# Patient Record
Sex: Male | Born: 1950 | Race: Black or African American | Hispanic: No | Marital: Single | Smoking: Never smoker
Health system: Southern US, Community
[De-identification: ages and names within clinical notes are randomized; demographics above are authoritative.]

## PROBLEM LIST (undated history)

## (undated) DIAGNOSIS — M1990 Unspecified osteoarthritis, unspecified site: Secondary | ICD-10-CM

## (undated) DIAGNOSIS — M1710 Unilateral primary osteoarthritis, unspecified knee: Secondary | ICD-10-CM

## (undated) HISTORY — PX: JOINT REPLACEMENT: SHX530

## (undated) HISTORY — PX: SHOULDER ARTHROSCOPY WITH ROTATOR CUFF REPAIR: SHX5685

## (undated) HISTORY — PX: COLONOSCOPY: SHX174

---

## 2000-01-24 ENCOUNTER — Encounter: Admission: RE | Admit: 2000-01-24 | Discharge: 2000-01-24 | Payer: Self-pay | Admitting: Family Medicine

## 2000-01-24 ENCOUNTER — Encounter: Payer: Self-pay | Admitting: Family Medicine

## 2009-08-28 ENCOUNTER — Emergency Department (HOSPITAL_COMMUNITY): Admission: EM | Admit: 2009-08-28 | Discharge: 2009-08-28 | Payer: Self-pay | Admitting: Emergency Medicine

## 2010-08-06 LAB — LIPASE, BLOOD: Lipase: 29 U/L (ref 11–59)

## 2010-08-06 LAB — COMPREHENSIVE METABOLIC PANEL
ALT: 21 U/L (ref 0–53)
AST: 29 U/L (ref 0–37)
Albumin: 4.4 g/dL (ref 3.5–5.2)
Alkaline Phosphatase: 70 U/L (ref 39–117)
BUN: 16 mg/dL (ref 6–23)
CO2: 24 mEq/L (ref 19–32)
Calcium: 9.5 mg/dL (ref 8.4–10.5)
Chloride: 101 mEq/L (ref 96–112)
Creatinine, Ser: 1.18 mg/dL (ref 0.4–1.5)
GFR calc Af Amer: >60 mL/min (ref >60)
GFR calc non Af Amer: >60 mL/min (ref >60)
Glucose, Bld: 99 mg/dL (ref 70–99)
Potassium: 3.4 mEq/L — ABNORMAL LOW (ref 3.5–5.1)
Sodium: 135 mEq/L (ref 135–145)
Total Bilirubin: 2.9 mg/dL — ABNORMAL HIGH (ref 0.3–1.2)
Total Protein: 7.9 g/dL (ref 6.0–8.3)

## 2010-08-06 LAB — DIFFERENTIAL
Basophils Absolute: 0 10*3/uL (ref 0.0–0.1)
Lymphocytes Relative: 14 % (ref 12–46)
Lymphs Abs: 1.5 10*3/uL (ref 0.7–4.0)
Monocytes Absolute: 0.8 10*3/uL (ref 0.1–1.0)
Neutro Abs: 8.9 10*3/uL — ABNORMAL HIGH (ref 1.7–7.7)

## 2010-08-06 LAB — CBC
HCT: 46.9 % (ref 39.0–52.0)
Hemoglobin: 16.4 g/dL (ref 13.0–17.0)
MCHC: 35 g/dL (ref 30.0–36.0)
MCV: 102 fL — ABNORMAL HIGH (ref 78.0–100.0)
Platelets: 246 10*3/uL (ref 150–400)
RBC: 4.6 MIL/uL (ref 4.22–5.81)
RDW: 14.7 % (ref 11.5–15.5)
WBC: 11.3 10*3/uL — ABNORMAL HIGH (ref 4.0–10.5)

## 2010-08-06 LAB — HEMOCCULT GUIAC POC 1CARD (OFFICE): Fecal Occult Bld: NEGATIVE

## 2011-07-01 ENCOUNTER — Ambulatory Visit: Payer: Non-veteran care | Admitting: Physical Therapy

## 2011-07-01 DIAGNOSIS — M25519 Pain in unspecified shoulder: Secondary | ICD-10-CM | POA: Insufficient documentation

## 2011-07-01 DIAGNOSIS — M25619 Stiffness of unspecified shoulder, not elsewhere classified: Secondary | ICD-10-CM | POA: Insufficient documentation

## 2011-07-01 DIAGNOSIS — IMO0001 Reserved for inherently not codable concepts without codable children: Secondary | ICD-10-CM | POA: Insufficient documentation

## 2011-07-01 DIAGNOSIS — M6281 Muscle weakness (generalized): Secondary | ICD-10-CM | POA: Insufficient documentation

## 2011-07-08 ENCOUNTER — Ambulatory Visit: Payer: Non-veteran care | Admitting: Physical Therapy

## 2011-07-10 ENCOUNTER — Ambulatory Visit: Payer: Non-veteran care | Admitting: Physical Therapy

## 2011-07-14 ENCOUNTER — Ambulatory Visit: Payer: Non-veteran care | Admitting: Physical Therapy

## 2011-07-16 ENCOUNTER — Ambulatory Visit: Payer: Non-veteran care | Admitting: Physical Therapy

## 2011-07-20 ENCOUNTER — Encounter: Payer: Non-veteran care | Admitting: Physical Therapy

## 2011-07-22 ENCOUNTER — Ambulatory Visit: Payer: Non-veteran care | Admitting: Physical Therapy

## 2011-07-22 DIAGNOSIS — M25619 Stiffness of unspecified shoulder, not elsewhere classified: Secondary | ICD-10-CM | POA: Insufficient documentation

## 2011-07-22 DIAGNOSIS — IMO0001 Reserved for inherently not codable concepts without codable children: Secondary | ICD-10-CM | POA: Insufficient documentation

## 2011-07-22 DIAGNOSIS — M6281 Muscle weakness (generalized): Secondary | ICD-10-CM | POA: Insufficient documentation

## 2011-07-22 DIAGNOSIS — M25519 Pain in unspecified shoulder: Secondary | ICD-10-CM | POA: Insufficient documentation

## 2011-07-28 ENCOUNTER — Ambulatory Visit: Payer: Non-veteran care | Admitting: Physical Therapy

## 2011-07-30 ENCOUNTER — Ambulatory Visit: Payer: Non-veteran care | Admitting: Physical Therapy

## 2011-08-03 ENCOUNTER — Ambulatory Visit: Payer: Non-veteran care | Admitting: Physical Therapy

## 2011-08-05 ENCOUNTER — Ambulatory Visit: Payer: Non-veteran care | Admitting: Physical Therapy

## 2011-08-10 ENCOUNTER — Ambulatory Visit: Payer: Non-veteran care | Admitting: Physical Therapy

## 2011-08-12 ENCOUNTER — Ambulatory Visit: Payer: Non-veteran care | Admitting: Physical Therapy

## 2011-08-17 ENCOUNTER — Ambulatory Visit: Payer: Non-veteran care | Admitting: Physical Therapy

## 2011-08-17 DIAGNOSIS — M6281 Muscle weakness (generalized): Secondary | ICD-10-CM | POA: Insufficient documentation

## 2011-08-17 DIAGNOSIS — M25519 Pain in unspecified shoulder: Secondary | ICD-10-CM | POA: Insufficient documentation

## 2011-08-17 DIAGNOSIS — IMO0001 Reserved for inherently not codable concepts without codable children: Secondary | ICD-10-CM | POA: Insufficient documentation

## 2011-08-17 DIAGNOSIS — M25619 Stiffness of unspecified shoulder, not elsewhere classified: Secondary | ICD-10-CM | POA: Insufficient documentation

## 2011-08-19 ENCOUNTER — Ambulatory Visit: Payer: Non-veteran care | Admitting: Physical Therapy

## 2013-04-07 ENCOUNTER — Encounter (HOSPITAL_COMMUNITY): Payer: Self-pay | Admitting: Emergency Medicine

## 2013-04-07 ENCOUNTER — Emergency Department (INDEPENDENT_AMBULATORY_CARE_PROVIDER_SITE_OTHER)
Admission: EM | Admit: 2013-04-07 | Discharge: 2013-04-07 | Disposition: A | Discharge status: Home / Self Care | Payer: Self-pay | Source: Home / Self Care | Attending: Family Medicine | Admitting: Family Medicine

## 2013-04-07 DIAGNOSIS — K0889 Other specified disorders of teeth and supporting structures: Secondary | ICD-10-CM

## 2013-04-07 DIAGNOSIS — K089 Disorder of teeth and supporting structures, unspecified: Secondary | ICD-10-CM

## 2013-04-07 MED ORDER — DICLOFENAC POTASSIUM 50 MG PO TABS: 50.0000 mg | ORAL_TABLET | Freq: Three times a day (TID) | ORAL | Status: DC

## 2013-04-07 MED ORDER — CLINDAMYCIN HCL 300 MG PO CAPS: 300.0000 mg | ORAL_CAPSULE | Freq: Three times a day (TID) | ORAL | Status: DC

## 2013-04-07 NOTE — ED Notes (Signed)
C/o waking with right sided facial swelling today.  States has a bad tooth.  Pt is scheduled on Monday for dentist appt.  Having mild pain. No otc meds tried.

## 2013-04-07 NOTE — ED Provider Notes (Signed)
CSN: [id number]     Arrival date & time 04/07/13  1028 History   First MD Initiated Contact with Patient 04/07/13 1111     Chief Complaint  Patient presents with  . Dental Pain    right sided facial swelling onset today.    (Consider location/radiation/quality/duration/timing/severity/associated sxs/prior Treatment) Patient is a 62 y.o. male presenting with tooth pain. The history is provided by the patient.  Dental Pain Location:  Upper Upper teeth location:  6/RU cuspid Quality:  Sharp and constant Severity:  Mild Duration:  1 day Progression:  Worsening Chronicity:  New Context: poor dentition     History reviewed. No pertinent past medical history. History reviewed. No pertinent past surgical history. History reviewed. No pertinent family history. History  Substance Use Topics  . Smoking status: Never Smoker   . Smokeless tobacco: Not on file  . Alcohol Use: Yes    Review of Systems  Constitutional: Negative.   HENT: Positive for dental problem.     Allergies  Review of patient's allergies indicates no known allergies.  Home Medications   Current Outpatient Rx  Name  Route  Sig  Dispense  Refill  . clindamycin (CLEOCIN) 300 MG capsule   Oral   Take 1 capsule (300 mg total) by mouth 3 (three) times daily.   28 capsule   0   . diclofenac (CATAFLAM) 50 MG tablet   Oral   Take 1 tablet (50 mg total) by mouth 3 (three) times daily.   15 tablet   0    BP 124/73  Pulse 60  Temp(Src) 97.7 F (36.5 C) (Oral)  Resp 16  SpO2 99% Physical Exam  Nursing note and vitals reviewed. Constitutional: He appears well-developed and well-nourished.  HENT:  Head: Normocephalic.  Mouth/Throat: Uvula is midline, oropharynx is clear and moist and mucous membranes are normal. Abnormal dentition.      ED Course  Procedures (including critical care time) Labs Review Labs Reviewed - No data to display Imaging Review No results found.  EKG Interpretation      Date/Time:    Ventricular Rate:    PR Interval:    QRS Duration:   QT Interval:    QTC Calculation:   R Axis:     Text Interpretation:              MDM      Linna Hoff, MD 04/07/13 1136

## 2014-05-24 ENCOUNTER — Encounter (HOSPITAL_COMMUNITY): Payer: Self-pay | Admitting: *Deleted

## 2014-05-24 ENCOUNTER — Emergency Department (HOSPITAL_COMMUNITY)
Admission: EM | Admit: 2014-05-24 | Discharge: 2014-05-24 | Disposition: A | Discharge status: Home / Self Care | Payer: No Typology Code available for payment source | Attending: Emergency Medicine | Admitting: Emergency Medicine

## 2014-05-24 DIAGNOSIS — Y998 Other external cause status: Secondary | ICD-10-CM | POA: Diagnosis not present

## 2014-05-24 DIAGNOSIS — S199XXA Unspecified injury of neck, initial encounter: Secondary | ICD-10-CM | POA: Diagnosis not present

## 2014-05-24 DIAGNOSIS — Z792 Long term (current) use of antibiotics: Secondary | ICD-10-CM | POA: Diagnosis not present

## 2014-05-24 DIAGNOSIS — S29002A Unspecified injury of muscle and tendon of back wall of thorax, initial encounter: Secondary | ICD-10-CM | POA: Insufficient documentation

## 2014-05-24 DIAGNOSIS — Y92410 Unspecified street and highway as the place of occurrence of the external cause: Secondary | ICD-10-CM | POA: Insufficient documentation

## 2014-05-24 DIAGNOSIS — Y9389 Activity, other specified: Secondary | ICD-10-CM | POA: Insufficient documentation

## 2014-05-24 DIAGNOSIS — Z791 Long term (current) use of non-steroidal anti-inflammatories (NSAID): Secondary | ICD-10-CM | POA: Diagnosis not present

## 2014-05-24 MED ORDER — TRAMADOL HCL 50 MG PO TABS: 50.0000 mg | ORAL_TABLET | Freq: Four times a day (QID) | ORAL | Status: DC | PRN

## 2014-05-24 MED ORDER — METHOCARBAMOL 500 MG PO TABS: 500.0000 mg | ORAL_TABLET | Freq: Three times a day (TID) | ORAL | Status: DC | PRN
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
## 2014-05-24 NOTE — Discharge Instructions (Signed)
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
## 2014-05-24 NOTE — ED Notes (Signed)
Pa student at the bedside.

## 2015-04-16 ENCOUNTER — Ambulatory Visit: Payer: Medicare Other | Admitting: Family

## 2015-05-02 ENCOUNTER — Ambulatory Visit: Payer: Medicare Other | Admitting: Family

## 2015-05-19 HISTORY — PX: COLONOSCOPY: SHX174

## 2015-07-17 ENCOUNTER — Ambulatory Visit: Payer: Medicare Other | Admitting: Family

## 2016-08-20 ENCOUNTER — Ambulatory Visit: Payer: Self-pay | Admitting: Podiatry

## 2017-06-15 ENCOUNTER — Telehealth: Payer: Self-pay | Admitting: General Practice

## 2017-06-15 NOTE — Telephone Encounter (Signed)
Copied from [place] [id number]. Topic: Appointment Scheduling - Prior Auth Required for Appointment >> Jun 15, 2017 10:18 AM Antonieta Iba C wrote: Pt called in to request an apt with Dr. Jenny Reichmann, pt says that his wife Mark Cordova is a current pt. He would like to know if provider would take him on?   CB: [phone]     Dr.John I thought I would ask, because he had a Cancel appointment and 2 No shows trying to est care with [facility] in the past. Please advise.

## 2017-06-15 NOTE — Telephone Encounter (Signed)
Ok with me 

## 2017-06-15 NOTE — Telephone Encounter (Signed)
Called patient and NO VM set up.   If patient calls back please set them up a New patient appointment with Dr.John.

## 2017-07-02 ENCOUNTER — Ambulatory Visit (INDEPENDENT_AMBULATORY_CARE_PROVIDER_SITE_OTHER): Payer: Non-veteran care | Admitting: Orthopaedic Surgery

## 2017-07-02 ENCOUNTER — Ambulatory Visit (INDEPENDENT_AMBULATORY_CARE_PROVIDER_SITE_OTHER): Payer: Non-veteran care

## 2017-07-02 DIAGNOSIS — M1711 Unilateral primary osteoarthritis, right knee: Secondary | ICD-10-CM

## 2017-07-02 NOTE — Progress Notes (Signed)
   Office Visit Note   Patient: Mark Cordova           Date of Birth: 09-Jul-1950           MRN: [id number] Visit Date: 07/02/2017              Requested by: Tilda Franco, PA-C [id number], [place] [address] PCP: Patient, No Pcp Per   Assessment & Plan: Visit Diagnoses:  1. Primary osteoarthritis of right knee     Plan: Impression is right knee degenerative joint disease.  We reviewed the x-rays with the patient today.  He is ready for a total knee replacement.  We discussed the risk benefits alternatives of surgery he understands and wished to proceed.  Patient is quite healthy and will without any history of DVT, tobacco use, diabetes.  Questions encouraged and answered.  Follow-Up Instructions: Return if symptoms worsen or fail to improve.   Orders:  Orders Placed This Encounter  Procedures  . XR Knee 1-2 Views Right   No orders of the defined types were placed in this encounter.     Procedures: No procedures performed   Clinical Data: No additional findings.   Subjective: Chief Complaint  Patient presents with  . Right Knee - Pain    Patient is a very pleasant 67 year old gentleman chronic right knee pain for years.  He denies any previous surgeries or injuries.  He served in the [employer].  He has significant difficulty with ADLs and chronic night pain.  He has tried cortisone injections as well as other conservative treatment without any significant relief.    Review of Systems  Constitutional: Negative.   All other systems reviewed and are negative.    Objective: Vital Signs: There were no vitals taken for this visit.  Physical Exam  Constitutional: He is oriented to person, place, and time. He appears well-developed and well-nourished.  HENT:  Head: Normocephalic and atraumatic.  Eyes: Pupils are equal, round, and reactive to light.  Neck: Neck supple.  Pulmonary/Chest: Effort normal.  Abdominal: Soft.    Musculoskeletal: Normal range of motion.  Neurological: He is alert and oriented to person, place, and time.  Skin: Skin is warm.  Psychiatric: He has a normal mood and affect. His behavior is normal. Judgment and thought content normal.  Nursing note and vitals reviewed.   Ortho Exam Right knee exam shows a small effusion.  Mild valgus alignment.  Range of motion is well-preserved.  Collaterals and cruciates are stable. Specialty Comments:  No specialty comments available.  Imaging: Xr Knee 1-2 Views Right  Result Date: 07/02/2017 Advanced degenerative joint disease with valgus alignment    PMFS History: There are no active problems to display for this patient.  No past medical history on file.  No family history on file.  No past surgical history on file. Social History   Occupational History  . Not on file  Tobacco Use  . Smoking status: Never Smoker  Substance and Sexual Activity  . Alcohol use: Yes  . Drug use: No  . Sexual activity: Yes    Birth control/protection: None

## 2017-07-05 ENCOUNTER — Other Ambulatory Visit (INDEPENDENT_AMBULATORY_CARE_PROVIDER_SITE_OTHER): Payer: Self-pay

## 2017-07-06 ENCOUNTER — Inpatient Hospital Stay (HOSPITAL_COMMUNITY)
Admission: RE | Admit: 2017-07-06 | Discharge: 2017-07-06 | Disposition: A | Discharge status: Home / Self Care | Payer: Medicare Other | Source: Ambulatory Visit

## 2017-07-06 NOTE — Pre-Procedure Instructions (Signed)
[facility]  07/06/2017      [facility] [address] [place], [place] [address] Phone: [phone] Fax: [phone]    Your procedure is scheduled on 07/14/2017.  Report to [facility] Admitting at 1030 A.M.  Call this number if you have problems the morning of surgery:  [phone]   Remember:  Do not eat food or drink liquids after midnight.   Continue all medications as directed by your physician except follow these medication instructions before surgery below   Take these medicines the morning of surgery with A SIP OF WATER: NONE  7 days prior to surgery STOP taking any Aspirin (unless otherwise instructed by your surgeon), Aleve, Naproxen, Ibuprofen, Motrin, Advil, Goody's, BC's, all herbal medications, fish oil, and all vitamins  Follow your doctors instructions regarding your Aspirin.  If no instructions were given by your doctor, then you will need to call the prescribing office office to get instructions.      Do not wear jewelry.  Do not wear lotions, powders, or colognes, or deodorant.  Men may shave face and neck.  Do not bring valuables to the hospital.  [facility] is not responsible for any belongings or valuables.  Hearing aids, eyeglasses, contacts, dentures, partials or bridgework may not be worn into surgery.  Leave your suitcase in the car.  After surgery it may be brought to your room.  For patients admitted to the hospital, discharge time will be determined by your treatment team.  Patients discharged the day of surgery will not be allowed to drive home.   Name and phone number of your driver:    Special instructions:   [facility]- Preparing For Surgery  Before surgery, you can play an important role. Because skin is not sterile, your skin needs to be as free of germs as possible. You can reduce the number of germs on your skin by washing with CHG (chlorahexidine  gluconate) Soap before surgery.  CHG is an antiseptic cleaner which kills germs and bonds with the skin to continue killing germs even after washing.  Please do not use if you have an allergy to CHG or antibacterial soaps. If your skin becomes reddened/irritated stop using the CHG.  Do not shave (including legs and underarms) for at least 48 hours prior to first CHG shower. It is OK to shave your face.  Please follow these instructions carefully.   1. Shower the NIGHT BEFORE SURGERY and the MORNING OF SURGERY with CHG.   2. If you chose to wash your hair, wash your hair first as usual with your normal shampoo.  3. After you shampoo, rinse your hair and body thoroughly to remove the shampoo.  4. Use CHG as you would any other liquid soap. You can apply CHG directly to the skin and wash gently with a scrungie or a clean washcloth.   5. Apply the CHG Soap to your body ONLY FROM THE NECK DOWN.  Do not use on open wounds or open sores. Avoid contact with your eyes, ears, mouth and genitals (private parts). Wash Face and genitals (private parts)  with your normal soap.  6. Wash thoroughly, paying special attention to the area where your surgery will be performed.  7. Thoroughly rinse your body with warm water from the neck down.  8. DO NOT shower/wash with your normal soap after using and rinsing off the CHG Soap.  9. Pat yourself  dry with a CLEAN TOWEL.  10. Wear CLEAN PAJAMAS to bed the night before surgery, wear comfortable clothes the morning of surgery  11. Place CLEAN SHEETS on your bed the night of your first shower and DO NOT SLEEP WITH PETS.    Day of Surgery: Shower as stated above. Do not apply any deodorants/lotions.  Please wear clean clothes to the hospital/surgery center.      Please read over the following fact sheets that you were given.

## 2017-07-07 ENCOUNTER — Encounter (HOSPITAL_COMMUNITY): Payer: Self-pay

## 2017-07-07 ENCOUNTER — Other Ambulatory Visit: Payer: Self-pay

## 2017-07-07 ENCOUNTER — Ambulatory Visit (HOSPITAL_COMMUNITY)
Admission: RE | Admit: 2017-07-07 | Discharge: 2017-07-07 | Disposition: A | Discharge status: Home / Self Care | Payer: Medicare HMO | Source: Ambulatory Visit | Attending: Physician Assistant | Admitting: Physician Assistant

## 2017-07-07 ENCOUNTER — Encounter (HOSPITAL_COMMUNITY)
Admission: RE | Admit: 2017-07-07 | Discharge: 2017-07-07 | Disposition: A | Discharge status: Home / Self Care | Payer: Medicare HMO | Source: Ambulatory Visit | Attending: Orthopaedic Surgery | Admitting: Orthopaedic Surgery

## 2017-07-07 DIAGNOSIS — J449 Chronic obstructive pulmonary disease, unspecified: Secondary | ICD-10-CM | POA: Diagnosis not present

## 2017-07-07 DIAGNOSIS — Z01818 Encounter for other preprocedural examination: Secondary | ICD-10-CM | POA: Diagnosis not present

## 2017-07-07 DIAGNOSIS — M1711 Unilateral primary osteoarthritis, right knee: Secondary | ICD-10-CM | POA: Diagnosis not present

## 2017-07-07 DIAGNOSIS — R001 Bradycardia, unspecified: Secondary | ICD-10-CM | POA: Insufficient documentation

## 2017-07-07 HISTORY — DX: Unspecified osteoarthritis, unspecified site: M19.90

## 2017-07-07 LAB — CBC WITH DIFFERENTIAL/PLATELET
BASOS ABS: 0 10*3/uL (ref 0.0–0.1)
Basophils Relative: 0 %
EOS PCT: 2 %
Eosinophils Absolute: 0.1 10*3/uL (ref 0.0–0.7)
HCT: 40.3 % (ref 39.0–52.0)
Hemoglobin: 14.4 g/dL (ref 13.0–17.0)
Lymphocytes Relative: 41 %
Lymphs Abs: 1.9 10*3/uL (ref 0.7–4.0)
MCH: 35.4 pg — ABNORMAL HIGH (ref 26.0–34.0)
MCHC: 35.7 g/dL (ref 30.0–36.0)
MCV: 99 fL (ref 78.0–100.0)
MONO ABS: 0.3 10*3/uL (ref 0.1–1.0)
Monocytes Relative: 7 %
Neutro Abs: 2.3 10*3/uL (ref 1.7–7.7)
Neutrophils Relative %: 50 %
Platelets: 244 10*3/uL (ref 150–400)
RBC: 4.07 MIL/uL — ABNORMAL LOW (ref 4.22–5.81)
RDW: 14.6 % (ref 11.5–15.5)
WBC: 4.6 10*3/uL (ref 4.0–10.5)

## 2017-07-07 LAB — COMPREHENSIVE METABOLIC PANEL
ALT: 16 U/L — ABNORMAL LOW (ref 17–63)
AST: 22 U/L (ref 15–41)
Albumin: 3.6 g/dL (ref 3.5–5.0)
Alkaline Phosphatase: 69 U/L (ref 38–126)
Anion gap: 8 (ref 5–15)
BUN: 14 mg/dL (ref 6–20)
CHLORIDE: 104 mmol/L (ref 101–111)
CO2: 26 mmol/L (ref 22–32)
Calcium: 9.1 mg/dL (ref 8.9–10.3)
Creatinine, Ser: 1.01 mg/dL (ref 0.61–1.24)
GFR calc Af Amer: >60 mL/min (ref >60)
GFR calc non Af Amer: >60 mL/min (ref >60)
GLUCOSE: 88 mg/dL (ref 65–99)
POTASSIUM: 4.2 mmol/L (ref 3.5–5.1)
Sodium: 138 mmol/L (ref 135–145)
Total Bilirubin: 1.1 mg/dL (ref 0.3–1.2)
Total Protein: 6.3 g/dL — ABNORMAL LOW (ref 6.5–8.1)

## 2017-07-07 LAB — TYPE AND SCREEN
ABO/RH(D): A POS
Antibody Screen: NEGATIVE

## 2017-07-07 LAB — ABO/RH: ABO/RH(D): A POS

## 2017-07-07 LAB — PROTIME-INR
INR: 1.03
PROTHROMBIN TIME: 13.4 s (ref 11.4–15.2)

## 2017-07-07 LAB — SURGICAL PCR SCREEN
MRSA, PCR: NEGATIVE
STAPHYLOCOCCUS AUREUS: NEGATIVE

## 2017-07-07 LAB — APTT: APTT: 38 s — AB (ref 24–36)

## 2017-07-07 NOTE — Progress Notes (Signed)
PCP - [facility] Cardiologist - denies  Chest x-ray - 07/07/17 EKG - 07/07/17 Stress Test - debnies ECHO - denies Cardiac Cath - denies      Aspirin Instructions: last dose 07/07/17  Anesthesia review: NO  Patient denies shortness of breath, fever, cough and chest pain at PAT appointment   Patient verbalized understanding of instructions that were given to them at the PAT appointment. Patient was also instructed that they will need to review over the PAT instructions again at home before surgery.

## 2017-07-13 MED ORDER — TRANEXAMIC ACID 1000 MG/10ML IV SOLN
1000.0000 mg | INTRAVENOUS | Status: AC
  Administered 2017-07-14: 1000 mg via INTRAVENOUS
  Filled 2017-07-13: qty 1100

## 2017-07-13 MED ORDER — TRANEXAMIC ACID 1000 MG/10ML IV SOLN
2000.0000 mg | INTRAVENOUS | Status: DC
  Filled 2017-07-13: qty 20

## 2017-07-14 ENCOUNTER — Encounter (HOSPITAL_COMMUNITY): Payer: Self-pay | Admitting: Anesthesiology

## 2017-07-14 ENCOUNTER — Inpatient Hospital Stay (HOSPITAL_COMMUNITY): Payer: No Typology Code available for payment source | Admitting: Vascular Surgery

## 2017-07-14 ENCOUNTER — Inpatient Hospital Stay (HOSPITAL_COMMUNITY): Payer: No Typology Code available for payment source

## 2017-07-14 ENCOUNTER — Inpatient Hospital Stay (HOSPITAL_COMMUNITY): Payer: No Typology Code available for payment source | Admitting: Anesthesiology

## 2017-07-14 ENCOUNTER — Encounter (HOSPITAL_COMMUNITY)
Admission: RE | Disposition: A | Discharge status: Home Health | Payer: Self-pay | Source: Ambulatory Visit | Attending: Orthopaedic Surgery

## 2017-07-14 ENCOUNTER — Inpatient Hospital Stay (HOSPITAL_COMMUNITY)
Admission: RE | Admit: 2017-07-14 | Discharge: 2017-07-16 | DRG: 470 | Disposition: A | Discharge status: Home Health | Payer: No Typology Code available for payment source | Source: Ambulatory Visit | Attending: Orthopaedic Surgery | Admitting: Orthopaedic Surgery

## 2017-07-14 ENCOUNTER — Other Ambulatory Visit: Payer: Self-pay

## 2017-07-14 DIAGNOSIS — M25561 Pain in right knee: Secondary | ICD-10-CM | POA: Diagnosis present

## 2017-07-14 DIAGNOSIS — M1711 Unilateral primary osteoarthritis, right knee: Secondary | ICD-10-CM | POA: Diagnosis present

## 2017-07-14 DIAGNOSIS — Z96659 Presence of unspecified artificial knee joint: Secondary | ICD-10-CM

## 2017-07-14 DIAGNOSIS — D62 Acute posthemorrhagic anemia: Secondary | ICD-10-CM | POA: Diagnosis not present

## 2017-07-14 DIAGNOSIS — Z7982 Long term (current) use of aspirin: Secondary | ICD-10-CM

## 2017-07-14 HISTORY — PX: TOTAL KNEE ARTHROPLASTY: SHX125

## 2017-07-14 HISTORY — DX: Unilateral primary osteoarthritis, unspecified knee: M17.10

## 2017-07-14 SURGERY — ARTHROPLASTY, KNEE, TOTAL
Anesthesia: Spinal | Site: Knee | Laterality: Right

## 2017-07-14 MED ORDER — ASPIRIN EC 325 MG PO TBEC: 325.0000 mg | DELAYED_RELEASE_TABLET | Freq: Two times a day (BID) | ORAL | 0 refills | Status: DC

## 2017-07-14 MED ORDER — VANCOMYCIN HCL POWD
Status: DC | PRN
  Administered 2017-07-14: 1000 mg

## 2017-07-14 MED ORDER — CHLORHEXIDINE GLUCONATE 4 % EX LIQD: 60.0000 mL | Freq: Once | CUTANEOUS | Status: DC

## 2017-07-14 MED ORDER — MIDAZOLAM HCL 2 MG/2ML IJ SOLN
INTRAMUSCULAR | Status: AC
  Administered 2017-07-14: 2 mg via INTRAVENOUS
  Filled 2017-07-14: qty 2

## 2017-07-14 MED ORDER — PROPOFOL 10 MG/ML IV BOLUS
INTRAVENOUS | Status: DC | PRN
  Administered 2017-07-14 (×2): 20 mg via INTRAVENOUS
  Administered 2017-07-14: 10 mg via INTRAVENOUS
  Administered 2017-07-14: 20 mg via INTRAVENOUS

## 2017-07-14 MED ORDER — ONDANSETRON HCL 4 MG PO TABS: 4.0000 mg | ORAL_TABLET | Freq: Four times a day (QID) | ORAL | Status: DC | PRN

## 2017-07-14 MED ORDER — ROPIVACAINE HCL 7.5 MG/ML IJ SOLN
INTRAMUSCULAR | Status: DC | PRN
  Administered 2017-07-14: 20 mL via PERINEURAL

## 2017-07-14 MED ORDER — SENNOSIDES-DOCUSATE SODIUM 8.6-50 MG PO TABS: 1.0000 | ORAL_TABLET | Freq: Every evening | ORAL | 1 refills | Status: DC | PRN

## 2017-07-14 MED ORDER — ALUM & MAG HYDROXIDE-SIMETH 200-200-20 MG/5ML PO SUSP: 30.0000 mL | ORAL | Status: DC | PRN

## 2017-07-14 MED ORDER — TIZANIDINE HCL 4 MG PO TABS: 4.0000 mg | ORAL_TABLET | Freq: Four times a day (QID) | ORAL | 2 refills | Status: DC | PRN

## 2017-07-14 MED ORDER — BUPIVACAINE LIPOSOME 1.3 % IJ SUSP
INTRAMUSCULAR | Status: DC | PRN
  Administered 2017-07-14: 20 mL

## 2017-07-14 MED ORDER — OXYCODONE HCL ER 10 MG PO T12A: 10.0000 mg | EXTENDED_RELEASE_TABLET | Freq: Two times a day (BID) | ORAL | 0 refills | Status: DC

## 2017-07-14 MED ORDER — DOCUSATE SODIUM 100 MG PO CAPS
100.0000 mg | ORAL_CAPSULE | Freq: Two times a day (BID) | ORAL | Status: DC
  Administered 2017-07-14 – 2017-07-15 (×2): 100 mg via ORAL
  Filled 2017-07-14 (×2): qty 1

## 2017-07-14 MED ORDER — CEFAZOLIN SODIUM-DEXTROSE 2-4 GM/100ML-% IV SOLN
INTRAVENOUS | Status: AC
  Filled 2017-07-14: qty 100

## 2017-07-14 MED ORDER — METHOCARBAMOL 500 MG PO TABS
500.0000 mg | ORAL_TABLET | Freq: Four times a day (QID) | ORAL | Status: DC | PRN
  Administered 2017-07-14 – 2017-07-15 (×4): 500 mg via ORAL
  Filled 2017-07-14 (×4): qty 1

## 2017-07-14 MED ORDER — DEXTROSE 5 % IV SOLN: 500.0000 mg | Freq: Four times a day (QID) | INTRAVENOUS | Status: DC | PRN

## 2017-07-14 MED ORDER — HYDROCODONE-ACETAMINOPHEN 7.5-325 MG PO TABS: 1.0000 | ORAL_TABLET | Freq: Once | ORAL | Status: DC | PRN

## 2017-07-14 MED ORDER — METOCLOPRAMIDE HCL 5 MG/ML IJ SOLN: 5.0000 mg | Freq: Three times a day (TID) | INTRAMUSCULAR | Status: DC | PRN

## 2017-07-14 MED ORDER — PHENYLEPHRINE HCL 10 MG/ML IJ SOLN
INTRAMUSCULAR | Status: DC | PRN
  Administered 2017-07-14: 80 ug via INTRAVENOUS
  Administered 2017-07-14: 120 ug via INTRAVENOUS

## 2017-07-14 MED ORDER — MIDAZOLAM HCL 2 MG/2ML IJ SOLN
2.0000 mg | Freq: Once | INTRAMUSCULAR | Status: AC
  Administered 2017-07-14: 2 mg via INTRAVENOUS

## 2017-07-14 MED ORDER — SODIUM CHLORIDE 0.9% FLUSH
INTRAVENOUS | Status: DC | PRN
  Administered 2017-07-14: 40 mL

## 2017-07-14 MED ORDER — ONDANSETRON HCL 4 MG/2ML IJ SOLN
4.0000 mg | Freq: Four times a day (QID) | INTRAMUSCULAR | Status: DC | PRN
  Administered 2017-07-14 – 2017-07-15 (×2): 4 mg via INTRAVENOUS
  Filled 2017-07-14 (×2): qty 2

## 2017-07-14 MED ORDER — ACYCLOVIR 400 MG PO TABS: 400.0000 mg | ORAL_TABLET | Freq: Three times a day (TID) | ORAL | Status: DC | PRN

## 2017-07-14 MED ORDER — 0.9 % SODIUM CHLORIDE (POUR BTL) OPTIME
TOPICAL | Status: DC | PRN
  Administered 2017-07-14: 1000 mL

## 2017-07-14 MED ORDER — CEFAZOLIN SODIUM-DEXTROSE 2-4 GM/100ML-% IV SOLN
2.0000 g | INTRAVENOUS | Status: AC
  Administered 2017-07-14: 2 g via INTRAVENOUS

## 2017-07-14 MED ORDER — DIPHENHYDRAMINE HCL 12.5 MG/5ML PO ELIX: 25.0000 mg | ORAL_SOLUTION | ORAL | Status: DC | PRN

## 2017-07-14 MED ORDER — DEXAMETHASONE SODIUM PHOSPHATE 10 MG/ML IJ SOLN
INTRAMUSCULAR | Status: DC | PRN
  Administered 2017-07-14: 10 mg via INTRAVENOUS

## 2017-07-14 MED ORDER — FENTANYL CITRATE (PF) 100 MCG/2ML IJ SOLN
100.0000 ug | Freq: Once | INTRAMUSCULAR | Status: AC
  Administered 2017-07-14: 100 ug via INTRAVENOUS

## 2017-07-14 MED ORDER — VANCOMYCIN HCL 1000 MG IV SOLR
INTRAVENOUS | Status: AC
  Filled 2017-07-14: qty 1000

## 2017-07-14 MED ORDER — FENTANYL CITRATE (PF) 100 MCG/2ML IJ SOLN
INTRAMUSCULAR | Status: AC
  Administered 2017-07-14: 100 ug via INTRAVENOUS
  Filled 2017-07-14: qty 2

## 2017-07-14 MED ORDER — LACTATED RINGERS IV SOLN
INTRAVENOUS | Status: DC
  Administered 2017-07-14: 11:00:00 via INTRAVENOUS

## 2017-07-14 MED ORDER — HYDROMORPHONE HCL 1 MG/ML IJ SOLN: 0.2500 mg | INTRAMUSCULAR | Status: DC | PRN

## 2017-07-14 MED ORDER — MAGNESIUM CITRATE PO SOLN: 1.0000 | Freq: Once | ORAL | Status: DC | PRN

## 2017-07-14 MED ORDER — ASPIRIN EC 325 MG PO TBEC
325.0000 mg | DELAYED_RELEASE_TABLET | Freq: Two times a day (BID) | ORAL | Status: DC
  Administered 2017-07-14 – 2017-07-15 (×3): 325 mg via ORAL
  Filled 2017-07-14 (×4): qty 1

## 2017-07-14 MED ORDER — MEPERIDINE HCL 50 MG/ML IJ SOLN: 6.2500 mg | INTRAMUSCULAR | Status: DC | PRN

## 2017-07-14 MED ORDER — PHENOL 1.4 % MT LIQD: 1.0000 | OROMUCOSAL | Status: DC | PRN

## 2017-07-14 MED ORDER — TRANEXAMIC ACID 1000 MG/10ML IV SOLN
1000.0000 mg | Freq: Once | INTRAVENOUS | Status: AC
  Administered 2017-07-14: 1000 mg via INTRAVENOUS
  Filled 2017-07-14: qty 10

## 2017-07-14 MED ORDER — PROPOFOL 500 MG/50ML IV EMUL
INTRAVENOUS | Status: DC | PRN
  Administered 2017-07-14: 75 ug/kg/min via INTRAVENOUS

## 2017-07-14 MED ORDER — LACTATED RINGERS IV SOLN
INTRAVENOUS | Status: DC | PRN
  Administered 2017-07-14 (×2): via INTRAVENOUS

## 2017-07-14 MED ORDER — DEXAMETHASONE SODIUM PHOSPHATE 10 MG/ML IJ SOLN
10.0000 mg | Freq: Once | INTRAMUSCULAR | Status: AC
  Administered 2017-07-15: 10 mg via INTRAVENOUS
  Filled 2017-07-14: qty 1

## 2017-07-14 MED ORDER — PROMETHAZINE HCL 25 MG PO TABS: 25.0000 mg | ORAL_TABLET | Freq: Four times a day (QID) | ORAL | 1 refills | Status: DC | PRN

## 2017-07-14 MED ORDER — SODIUM CHLORIDE 0.9 % IV SOLN
INTRAVENOUS | Status: DC
  Administered 2017-07-14 – 2017-07-15 (×2): via INTRAVENOUS

## 2017-07-14 MED ORDER — ONDANSETRON HCL 4 MG PO TABS
4.0000 mg | ORAL_TABLET | Freq: Three times a day (TID) | ORAL | 0 refills | Status: DC | PRN
Start: 2017-07-14 — End: 2019-09-19

## 2017-07-14 MED ORDER — ONDANSETRON HCL 4 MG/2ML IJ SOLN: 4.0000 mg | Freq: Once | INTRAMUSCULAR | Status: DC | PRN

## 2017-07-14 MED ORDER — BUPIVACAINE IN DEXTROSE 0.75-8.25 % IT SOLN
INTRATHECAL | Status: DC | PRN
  Administered 2017-07-14: 2 mL via INTRATHECAL

## 2017-07-14 MED ORDER — CEFAZOLIN SODIUM-DEXTROSE 2-4 GM/100ML-% IV SOLN
2.0000 g | Freq: Four times a day (QID) | INTRAVENOUS | Status: AC
  Administered 2017-07-14 – 2017-07-15 (×3): 2 g via INTRAVENOUS
  Filled 2017-07-14 (×3): qty 100

## 2017-07-14 MED ORDER — VITAMIN D 1000 UNITS PO TABS
1000.0000 [IU] | ORAL_TABLET | Freq: Every day | ORAL | Status: DC
  Administered 2017-07-15: 1000 [IU] via ORAL

## 2017-07-14 MED ORDER — OXYCODONE HCL 5 MG PO TABS: 5.0000 mg | ORAL_TABLET | ORAL | 0 refills | Status: DC | PRN

## 2017-07-14 MED ORDER — PHENYLEPHRINE HCL 10 MG/ML IJ SOLN
INTRAVENOUS | Status: DC | PRN
  Administered 2017-07-14: 25 ug/min via INTRAVENOUS

## 2017-07-14 MED ORDER — OXYCODONE HCL 5 MG PO TABS
10.0000 mg | ORAL_TABLET | ORAL | Status: DC | PRN
  Administered 2017-07-14 – 2017-07-15 (×4): 10 mg via ORAL
  Filled 2017-07-14 (×4): qty 2

## 2017-07-14 MED ORDER — SODIUM CHLORIDE 0.9 % IR SOLN
Status: DC | PRN
  Administered 2017-07-14: 1

## 2017-07-14 MED ORDER — METOCLOPRAMIDE HCL 5 MG PO TABS: 5.0000 mg | ORAL_TABLET | Freq: Three times a day (TID) | ORAL | Status: DC | PRN

## 2017-07-14 MED ORDER — POLYETHYLENE GLYCOL 3350 17 G PO PACK: 17.0000 g | PACK | Freq: Every day | ORAL | Status: DC | PRN

## 2017-07-14 MED ORDER — BUPIVACAINE LIPOSOME 1.3 % IJ SUSP
20.0000 mL | INTRAMUSCULAR | Status: DC
  Filled 2017-07-14: qty 20

## 2017-07-14 MED ORDER — SORBITOL 70 % SOLN: 30.0000 mL | Freq: Every day | Status: DC | PRN

## 2017-07-14 MED ORDER — MENTHOL 3 MG MT LOZG: 1.0000 | LOZENGE | OROMUCOSAL | Status: DC | PRN

## 2017-07-14 SURGICAL SUPPLY — 71 items
ALCOHOL ISOPROPYL (RUBBING) (MISCELLANEOUS) ×2 IMPLANT
APL SKNCLS STERI-STRIP NONHPOA (GAUZE/BANDAGES/DRESSINGS) ×1
BAG DECANTER FOR FLEXI CONT (MISCELLANEOUS) ×2 IMPLANT
BANDAGE ACE 6X5 VEL STRL LF (GAUZE/BANDAGES/DRESSINGS) ×1 IMPLANT
BANDAGE ESMARK 6X9 LF (GAUZE/BANDAGES/DRESSINGS) ×1 IMPLANT
BENZOIN TINCTURE PRP APPL 2/3 (GAUZE/BANDAGES/DRESSINGS) ×2 IMPLANT
BLADE SAW SGTL 13.0X1.19X90.0M (BLADE) ×2 IMPLANT
BNDG CMPR 9X6 STRL LF SNTH (GAUZE/BANDAGES/DRESSINGS) ×1
BNDG CMPR MED 10X6 ELC LF (GAUZE/BANDAGES/DRESSINGS) ×1
BNDG ELASTIC 6X10 VLCR STRL LF (GAUZE/BANDAGES/DRESSINGS) ×2 IMPLANT
BNDG ESMARK 6X9 LF (GAUZE/BANDAGES/DRESSINGS) ×2
BOWL SMART MIX CTS (DISPOSABLE) ×2 IMPLANT
CAPT KNEE TOTAL 3 ×1 IMPLANT
CEMENT BONE REFOBACIN R1X40 US (Cement) ×2 IMPLANT
CLSR STERI-STRIP ANTIMIC 1/2X4 (GAUZE/BANDAGES/DRESSINGS) ×4 IMPLANT
COVER SURGICAL LIGHT HANDLE (MISCELLANEOUS) ×2 IMPLANT
CUFF TOURNIQUET SINGLE 34IN LL (TOURNIQUET CUFF) ×2 IMPLANT
CUFF TOURNIQUET SINGLE 44IN (TOURNIQUET CUFF) IMPLANT
DRAPE EXTREMITY T 121X128X90 (DRAPE) ×2 IMPLANT
DRAPE HALF SHEET 40X57 (DRAPES) ×2 IMPLANT
DRAPE INCISE IOBAN 66X45 STRL (DRAPES) IMPLANT
DRAPE ORTHO SPLIT 77X108 STRL (DRAPES) ×4
DRAPE POUCH INSTRU U-SHP 10X18 (DRAPES) ×2 IMPLANT
DRAPE SURG 17X11 SM STRL (DRAPES) ×4 IMPLANT
DRAPE SURG ORHT 6 SPLT 77X108 (DRAPES) ×2 IMPLANT
DRSG AQUACEL AG ADV 3.5X14 (GAUZE/BANDAGES/DRESSINGS) ×2 IMPLANT
DURAPREP 26ML APPLICATOR (WOUND CARE) ×4 IMPLANT
ELECT CAUTERY BLADE 6.4 (BLADE) ×2 IMPLANT
ELECT REM PT RETURN 9FT ADLT (ELECTROSURGICAL) ×2
ELECTRODE REM PT RTRN 9FT ADLT (ELECTROSURGICAL) ×1 IMPLANT
GLOVE BIOGEL PI IND STRL 7.0 (GLOVE) ×1 IMPLANT
GLOVE BIOGEL PI INDICATOR 7.0 (GLOVE) ×1
GLOVE ECLIPSE 7.0 STRL STRAW (GLOVE) ×2 IMPLANT
GLOVE SKINSENSE NS SZ7.5 (GLOVE) ×1
GLOVE SKINSENSE STRL SZ7.5 (GLOVE) ×1 IMPLANT
GLOVE SURG SYN 7.5  E (GLOVE) ×4
GLOVE SURG SYN 7.5 E (GLOVE) ×4 IMPLANT
GLOVE SURG SYN 7.5 PF PI (GLOVE) ×4 IMPLANT
GOWN STRL REIN XL XLG (GOWN DISPOSABLE) ×2 IMPLANT
GOWN STRL REUS W/ TWL LRG LVL3 (GOWN DISPOSABLE) ×1 IMPLANT
GOWN STRL REUS W/TWL LRG LVL3 (GOWN DISPOSABLE) ×2
HANDPIECE INTERPULSE COAX TIP (DISPOSABLE) ×2
HOOD PEEL AWAY FLYTE STAYCOOL (MISCELLANEOUS) ×4 IMPLANT
KIT BASIN OR (CUSTOM PROCEDURE TRAY) ×2 IMPLANT
KIT ROOM TURNOVER OR (KITS) ×2 IMPLANT
MANIFOLD NEPTUNE II (INSTRUMENTS) ×2 IMPLANT
MARKER SKIN DUAL TIP RULER LAB (MISCELLANEOUS) ×2 IMPLANT
NDL SPNL 18GX3.5 QUINCKE PK (NEEDLE) ×1 IMPLANT
NEEDLE SPNL 18GX3.5 QUINCKE PK (NEEDLE) ×2 IMPLANT
NS IRRIG 1000ML POUR BTL (IV SOLUTION) ×2 IMPLANT
PACK TOTAL JOINT (CUSTOM PROCEDURE TRAY) ×2 IMPLANT
PAD ARMBOARD 7.5X6 YLW CONV (MISCELLANEOUS) ×4 IMPLANT
SAW OSC TIP CART 19.5X105X1.3 (SAW) ×2 IMPLANT
SET HNDPC FAN SPRY TIP SCT (DISPOSABLE) ×1 IMPLANT
STAPLER VISISTAT 35W (STAPLE) IMPLANT
STRIP CLOSURE SKIN 1/2X4 (GAUZE/BANDAGES/DRESSINGS) ×1 IMPLANT
SUCTION FRAZIER HANDLE 10FR (MISCELLANEOUS) ×1
SUCTION TUBE FRAZIER 10FR DISP (MISCELLANEOUS) ×1 IMPLANT
SUT ETHILON 2 0 FS 18 (SUTURE) IMPLANT
SUT MNCRL AB 4-0 PS2 18 (SUTURE) IMPLANT
SUT VIC AB 0 CT1 27 (SUTURE) ×4
SUT VIC AB 0 CT1 27XBRD ANBCTR (SUTURE) ×2 IMPLANT
SUT VIC AB 1 CTX 27 (SUTURE) ×6 IMPLANT
SUT VIC AB 2-0 CT1 27 (SUTURE) ×6
SUT VIC AB 2-0 CT1 TAPERPNT 27 (SUTURE) ×3 IMPLANT
SYR 50ML LL SCALE MARK (SYRINGE) ×2 IMPLANT
TOWEL OR 17X24 6PK STRL BLUE (TOWEL DISPOSABLE) ×2 IMPLANT
TOWEL OR 17X26 10 PK STRL BLUE (TOWEL DISPOSABLE) ×2 IMPLANT
TRAY CATH 16FR W/PLASTIC CATH (SET/KITS/TRAYS/PACK) IMPLANT
UNDERPAD 30X30 (UNDERPADS AND DIAPERS) ×2 IMPLANT
WRAP KNEE MAXI GEL POST OP (GAUZE/BANDAGES/DRESSINGS) ×2 IMPLANT

## 2017-07-14 NOTE — Progress Notes (Signed)
Orthopedic Tech Progress Note Patient Details:  Mark Cordova Sep 26, 1950 [id number]  CPM Right Knee CPM Right Knee: On Right Knee Flexion (Degrees): 90 Right Knee Extension (Degrees): 0 Additional Comments: applied in pacu  Post Interventions Patient Tolerated: Well Instructions Provided: Care of device  Braulio Bosch 07/14/2017, 3:47 PM

## 2017-07-14 NOTE — Progress Notes (Signed)
On call notified that patient has nothing for pain. Awaiting call back

## 2017-07-14 NOTE — Op Note (Signed)
Total Knee Arthroplasty Procedure Note  Preoperative diagnosis: Right knee osteoarthritis  Postoperative diagnosis:same  Operative procedure: Right total knee arthroplasty. CPT [phone]  Surgeon: N. Eduard Roux, MD  Assist: Madalyn Rob, PA-C; necessary for the timely completion of procedure and due to complexity of procedure.  Anesthesia: Spinal, regional  Tourniquet time: 60 mins  Implants used: St[employer]ker Triatholon Femur: PS 7 Tibia: 7 Patella: 35 mm, 10 thick Polyethylene: 9 mm  Indication: Mark Cordova is a 67 y.o. year old male with a history of knee pain. Having failed conservative management, the patient elected to proceed with a total knee arthroplasty.  We have reviewed the risk and benefits of the surgery and they elected to proceed after voicing understanding.  Procedure:  After informed consent was obtained and understanding of the risk were voiced including but not limited to bleeding, infection, damage to surrounding structures including nerves and vessels, blood clots, leg length inequality and the failure to achieve desired results, the operative extremity was marked with verbal confirmation of the patient in the holding area.   The patient was then brought to the operating room and transported to the operating room table in the supine position.  A tourniquet was applied to the operative extremity around the upper thigh. The operative limb was then prepped and draped in the usual sterile fashion and preoperative antibiotics were administered.  A time out was performed prior to the start of surgery confirming the correct extremity, preoperative antibiotic administration, as well as team members, implants and instruments available for the case. Correct surgical site was also confirmed with preoperative radiographs. The limb was then elevated for exsanguination and the tourniquet was inflated. A midline incision was made and a standard medial parapatellar approach was  performed.  The patella was prepared and sized to a 35 mm.  A cover was placed on the patella for protection from retractors.  We then turned our attention to the femur. Posterior cruciate ligament was sacrificed. Start site was drilled in the femur and the intramedullary distal femoral cutting guide was placed, set at 5 degrees valgus, taking 11 mm of distal resection. The distal cut was made. Osteophytes were then removed. Next, the proximal tibial cutting guide was placed with appropriate slope, varus/valgus alignment and depth of resection. The proximal tibial cut was made. Gap blocks were then used to assess the extension gap and alignment, and appropriate soft tissue releases were performed. Attention was turned back to the femur, which was sized using the sizing guide to a size 7. Appropriate rotation of the femoral component was determined using epicondylar axis, Whiteside's line, and assessing the flexion gap under ligament tension. The appropriate size 4-in-1 cutting block was placed and cuts were made. Posterior femoral osteophytes and uncapped bone were then removed with the curved osteotome. The tibia was sized for a size 7 component. The femoral box-cutting guide was placed and prepared for a PS femoral component. Trial components were placed, and stability was checked in full extension, mid-flexion, and deep flexion. Proper tibial rotation was determined and marked.  The patella tracked well without a lateral release. Trial components were then removed and tibial preparation performed. A posterior capsular injection comprising of 20 cc of 1.3% exparel and 40 cc of normal saline was performed for postoperative pain control. The bony surfaces were irrigated with a pulse lavage and then dried. Bone cement was vacuum mixed on the back table, and the final components sized above were cemented into place. After cement had  finished curing, excess cement was removed. The stability of the construct was  re-evaluated throughout a range of motion and found to be acceptable. The trial liner was removed, the knee was copiously irrigated, and the knee was re-evaluated for any excess bone debris. The real polyethylene liner, 9 mm thick, was inserted and checked to ensure the locking mechanism had engaged appropriately. The tourniquet was deflated and hemostasis was achieved. The wound was irrigated with normal saline.  One gram of vancomycin powder was placed in the surgical bed. A drain was not placed. Capsular closure was performed with a #1 vicryl, subcutaneous fat closed with a 2.0 vicryl suture, then subcutaneous tissue closed with interrupted 2.0 vicryl suture. The skin was then closed with a 3.0 monocryl. A sterile dressing was applied.  The patient was awakened in the operating room and taken to recovery in stable condition. All sponge, needle, and instrument counts were correct at the end of the case.  Position: supine  Complications: none.  Time Out: performed   Drains/Packing: none  Estimated blood loss: minimal  Returned to Recovery Room: in good condition.   Antibiotics: yes   Mechanical VTE (DVT) Prophylaxis: sequential compression devices, TED thigh-high  Chemical VTE (DVT) Prophylaxis: aspirin  Fluid Replacement  Crystalloid: see anesthesia record Blood: none  FFP: none   Specimens Removed: 1 to pathology   Sponge and Instrument Count Correct? yes   PACU: portable radiograph - knee AP and Lateral   Admission: inpatient status  Plan/RTC: Return in 2 weeks for wound check.   Weight Bearing/Load Lower Extremity: full   N. Eduard Roux, MD [place] 2:12 PM

## 2017-07-14 NOTE — Progress Notes (Signed)
On call MD asked that oxy 10mg  q3-4 PRN for pain be ordered for pain. This was a verbal order.

## 2017-07-14 NOTE — Anesthesia Procedure Notes (Signed)
Spinal  Patient location during procedure: OR Start time: 07/14/2017 12:26 PM Staffing Anesthesiologist: Josephine Igo, MD Performed: anesthesiologist  Preanesthetic Checklist Completed: patient identified, site marked, surgical consent, pre-op evaluation, timeout performed, IV checked, risks and benefits discussed and monitors and equipment checked Spinal Block Patient position: sitting Prep: site prepped and draped and DuraPrep Patient monitoring: heart rate, cardiac monitor, continuous pulse ox and blood pressure Approach: midline Location: L3-4 Injection technique: single-shot Needle Needle type: Pencan  Needle gauge: 24 G Needle length: 9 cm Needle insertion depth: 4 cm Assessment Sensory level: T4 Additional Notes Patient tolerated procedure well. Adequate sensory level.

## 2017-07-14 NOTE — Discharge Instructions (Signed)

## 2017-07-14 NOTE — H&P (Signed)
    PREOPERATIVE H&P  Chief Complaint: right knee degenerative joint disease  HPI: Mark Cordova is a 67 y.o. male who presents for surgical treatment of right knee degenerative joint disease.  He denies any changes in medical history.  Past Medical History:  Diagnosis Date  . Arthritis    knees   Past Surgical History:  Procedure Laterality Date  . COLONOSCOPY    . rotator cuff Right    Social History   Socioeconomic History  . Marital status: Married    Spouse name: Not on file  . Number of children: Not on file  . Years of education: Not on file  . Highest education level: Not on file  Social Needs  . Financial resource strain: Not on file  . Food insecurity - worry: Not on file  . Food insecurity - inability: Not on file  . Transportation needs - medical: Not on file  . Transportation needs - non-medical: Not on file  Occupational History  . Not on file  Tobacco Use  . Smoking status: Never Smoker  . Smokeless tobacco: Never Used  Substance and Sexual Activity  . Alcohol use: Yes    Comment: occasionally  . Drug use: Yes    Types: Marijuana    Comment: last time 2 weeks   . Sexual activity: Yes    Birth control/protection: None  Other Topics Concern  . Not on file  Social History Narrative  . Not on file   No family history on file. No Known Allergies Prior to Admission medications   Medication Sig Start Date End Date Taking? Authorizing Provider  acyclovir (ZOVIRAX) 400 MG tablet Take 400 mg by mouth 3 (three) times daily as needed (outbreaks).   Yes [provider]  aspirin EC 81 MG tablet Take 81 mg by mouth daily.   Yes [provider]  Cholecalciferol (VITAMIN D) 2000 units CAPS Take 1 capsule by mouth daily.   Yes [provider]     Positive ROS: All other systems have been reviewed and were otherwise negative with the exception of those mentioned in the HPI and as above.  Physical Exam: General: Alert, no acute  distress Cardiovascular: No pedal edema Respiratory: No cyanosis, no use of accessory musculature GI: abdomen soft Skin: No lesions in the area of chief complaint Neurologic: Sensation intact distally Psychiatric: Patient is competent for consent with normal mood and affect Lymphatic: no lymphedema  MUSCULOSKELETAL: exam stable  Assessment: right knee degenerative joint disease  Plan: Plan for Procedure(s): RIGHT TOTAL KNEE ARTHROPLASTY  The risks benefits and alternatives were discussed with the patient including but not limited to the risks of nonoperative treatment, versus surgical intervention including infection, bleeding, nerve injury,  blood clots, cardiopulmonary complications, morbidity, mortality, among others, and they were willing to proceed.   Eduard Roux, MD   07/14/2017 7:33 AM

## 2017-07-14 NOTE — Evaluation (Signed)
Physical Therapy Evaluation Patient Details Name: Mark Cordova MRN: [id number] DOB: 04/22/1951 Today's Date: 07/14/2017   History of Present Illness  Pt is a 67 y/o male s/p elective R TKA. PMH includes R rotator cuff repair.   Clinical Impression  Pt s/p surgery above with deficits below. Pt with R knee buckling and decreased sensation in RLE, therefore mobility limited to chair. Required min A with RW throughout. Will continue to follow acutely to maximize functional mobility independence and safety.     Follow Up Recommendations Follow surgeon's recommendation for DC plan and follow-up therapies;Supervision for mobility/OOB    Equipment Recommendations  Rolling walker with 5" wheels;3in1 (PT)    Recommendations for Other Services       Precautions / Restrictions Precautions Precautions: Knee Precaution Booklet Issued: Yes (comment) Precaution Comments: Reviewed knee precautions and supine ther ex.  Restrictions Weight Bearing Restrictions: Yes RLE Weight Bearing: Weight bearing as tolerated      Mobility  Bed Mobility Overal bed mobility: Needs Assistance Bed Mobility: Supine to Sit     Supine to sit: Supervision     General bed mobility comments: Supervision for safety. Increased time required.   Transfers Overall transfer level: Needs assistance Equipment used: Rolling walker (2 wheeled) Transfers: Sit to/from [facility] Sit to Stand: Min assist;From elevated surface Stand pivot transfers: Min assist       General transfer comment: Min A for lift assist and steadying assist from elevated surface. Verbal cues for safe hand placement. Demonstrated R knee buckling, therefore distance limited to stand pivot to chair. Verbal cues to press through UEs when taking steps with RLE and verbal cues for sequencing using RW.   Ambulation/Gait             General Gait Details: NT  Stairs            Wheelchair Mobility    Modified Rankin  (Stroke Patients Only)       Balance Overall balance assessment: Needs assistance Sitting-balance support: No upper extremity supported;Feet supported Sitting balance-Leahy Scale: Good     Standing balance support: Bilateral upper extremity supported;During functional activity Standing balance-Leahy Scale: Poor Standing balance comment: Reliant on BUE support.                              Pertinent Vitals/Pain Pain Assessment: No/denies pain    Home Living Family/patient expects to be discharged to:: Private residence Living Arrangements: Spouse/significant other Available Help at Discharge: Family;Available 24 hours/day Type of Home: House Home Access: Level entry     Home Layout: One level Home Equipment: Crutches;Cane - single point      Prior Function Level of Independence: Independent               Hand Dominance   Dominant Hand: Right    Extremity/Trunk Assessment   Upper Extremity Assessment Upper Extremity Assessment: Defer to OT evaluation    Lower Extremity Assessment Lower Extremity Assessment: RLE deficits/detail RLE Deficits / Details: Reports some numbness at R knee up to hip. Deficits consistent with post op pain and weakness. Able to perform ther ex below.     Cervical / Trunk Assessment Cervical / Trunk Assessment: Normal  Communication   Communication: No difficulties  Cognition Arousal/Alertness: Awake/alert Behavior During Therapy: WFL for tasks assessed/performed Overall Cognitive Status: Within Functional Limits for tasks assessed  General Comments      Exercises Total Joint Exercises Ankle Circles/Pumps: AROM;Both;20 reps;Supine Quad Sets: AROM;Right;10 reps Towel Squeeze: AROM;Both;10 reps Heel Slides: AROM;Right;10 reps   Assessment/Plan    PT Assessment Patient needs continued PT services  PT Problem List Decreased strength;Decreased  balance;Decreased mobility;Decreased coordination;Decreased knowledge of use of DME;Decreased knowledge of precautions;Impaired sensation       PT Treatment Interventions DME instruction;Gait training;Functional mobility training;Therapeutic activities;Therapeutic exercise;Balance training;Neuromuscular re-education;Patient/family education    PT Goals (Current goals can be found in the Care Plan section)  Acute Rehab PT Goals Patient Stated Goal: to get better ASAP  PT Goal Formulation: With patient Time For Goal Achievement: 07/28/17 Potential to Achieve Goals: Good    Frequency 7X/week   Barriers to discharge        Co-evaluation               AM-PAC PT "6 Clicks" Daily Activity  Outcome Measure Difficulty turning over in bed (including adjusting bedclothes, sheets and blankets)?: A Little Difficulty moving from lying on back to sitting on the side of the bed? : A Little Difficulty sitting down on and standing up from a chair with arms (e.g., wheelchair, bedside commode, etc,.)?: Unable Help needed moving to and from a bed to chair (including a wheelchair)?: A Little Help needed walking in hospital room?: A Lot Help needed climbing 3-5 steps with a railing? : A Lot 6 Click Score: 14    End of Session Equipment Utilized During Treatment: Gait belt Activity Tolerance: Patient tolerated treatment well Patient left: in chair;with call bell/phone within reach Nurse Communication: Mobility status PT Visit Diagnosis: Other abnormalities of gait and mobility (R26.89);Muscle weakness (generalized) (M62.81);Unsteadiness on feet (R26.81)    Time: 1761-6073 PT Time Calculation (min) (ACUTE ONLY): 26 min   Charges:   PT Evaluation $PT Eval Low Complexity: 1 Low PT Treatments $Therapeutic Activity: 8-22 mins   PT G Codes:        Mark Cordova, PT, DPT  Acute Rehabilitation Services  Pager: [phone]   Mark Cordova 07/14/2017, 4:39 PM

## 2017-07-14 NOTE — Transfer of Care (Signed)
Immediate Anesthesia Transfer of Care Note  Patient: [name]  Procedure(s) Performed: RIGHT TOTAL KNEE ARTHROPLASTY (Right Knee)  Patient Location: PACU  Anesthesia Type:Spinal  Level of Consciousness: sedated and patient cooperative  Airway & Oxygen Therapy: Patient Spontanous Breathing and Patient connected to nasal cannula oxygen  Post-op Assessment: Report given to RN and Post -op Vital signs reviewed and stable  Post vital signs: stable  Last Vitals:  Vitals:   07/14/17 1503 07/14/17 1504  BP: 105/77 105/77  Pulse:  (!) 58  Resp:  15  Temp:  36.7 C  SpO2: 100% 100%    Last Pain:  Vitals:   07/14/17 1504  TempSrc:   PainSc: 0-No pain      Patients Stated Pain Goal: 3 (86/77/37 3668)  Complications: No apparent anesthesia complications

## 2017-07-14 NOTE — Anesthesia Procedure Notes (Addendum)
Anesthesia Regional Block: Adductor canal block   Pre-Anesthetic Checklist: ,, timeout performed, Correct Patient, Correct Site, Correct Laterality, Correct Procedure, Correct Position, site marked, Risks and benefits discussed,  Surgical consent,  Pre-op evaluation,  At surgeon's request and post-op pain management  Laterality: Right  Prep: chloraprep       Needles:  Injection technique: Single-shot  Needle Type: Echogenic Stimulator Needle     Needle Length: 10cm  Needle Gauge: 21   Needle insertion depth: 5 cm   Additional Needles:   Procedures:,,,, ultrasound used (permanent image in chart),,,,  Narrative:  Start time: 07/14/2017 11:30 AM End time: 07/14/2017 11:35 AM Injection made incrementally with aspirations every 5 mL.  Performed by: Personally  Anesthesiologist: Josephine Igo, MD  Additional Notes: Timeout performed. Patient sedated. Relevant anatomy ID'd using [place]. Incremental 2-54ml injection of LA with frequent aspiration. Patient tolerated procedure well.        Right Adductor Canal Block

## 2017-07-14 NOTE — Anesthesia Preprocedure Evaluation (Addendum)
Anesthesia Evaluation  Patient identified by MRN, date of birth, ID band Patient awake    Reviewed: Allergy & Precautions, NPO status , Patient's Chart, lab work & pertinent test results  Airway Mallampati: II  TM Distance: >3 FB Neck ROM: Full    Dental  (+) Teeth Intact, Caps   Pulmonary neg pulmonary ROS,    Pulmonary exam normal breath sounds clear to auscultation       Cardiovascular negative cardio ROS Normal cardiovascular exam Rhythm:Regular Rate:Normal     Neuro/Psych negative neurological ROS  negative psych ROS   GI/Hepatic negative GI ROS, Neg liver ROS,   Endo/Other  negative endocrine ROS  Renal/GU negative Renal ROS  negative genitourinary   Musculoskeletal  (+) Arthritis , Osteoarthritis,  DJD right knee   Abdominal   Peds  Hematology negative hematology ROS (+)   Anesthesia Other Findings   Reproductive/Obstetrics                            Anesthesia Physical Anesthesia Plan  ASA: II  Anesthesia Plan: Spinal   Post-op Pain Management:  Regional for Post-op pain   Induction: Intravenous  PONV Risk Score and Plan: Ondansetron, Propofol infusion, Midazolam, Dexamethasone and Treatment may vary due to age or medical condition  Airway Management Planned: Natural Airway, Nasal Cannula and Simple Face Mask  Additional Equipment:   Intra-op Plan:   Post-operative Plan:   Informed Consent: I have reviewed the patients History and Physical, chart, labs and discussed the procedure including the risks, benefits and alternatives for the proposed anesthesia with the patient or authorized representative who has indicated his/her understanding and acceptance.   Dental advisory given  Plan Discussed with: CRNA, Anesthesiologist and Surgeon  Anesthesia Plan Comments:        Anesthesia Quick Evaluation

## 2017-07-15 ENCOUNTER — Ambulatory Visit: Payer: Medicare Other | Admitting: Adult Health

## 2017-07-15 ENCOUNTER — Encounter (HOSPITAL_COMMUNITY): Payer: Self-pay | Admitting: Orthopaedic Surgery

## 2017-07-15 ENCOUNTER — Other Ambulatory Visit: Payer: Self-pay

## 2017-07-15 LAB — BASIC METABOLIC PANEL
ANION GAP: 10 (ref 5–15)
BUN: 13 mg/dL (ref 6–20)
CO2: 23 mmol/L (ref 22–32)
Calcium: 8.4 mg/dL — ABNORMAL LOW (ref 8.9–10.3)
Chloride: 105 mmol/L (ref 101–111)
Creatinine, Ser: 0.82 mg/dL (ref 0.61–1.24)
GFR calc Af Amer: >60 mL/min (ref >60)
GLUCOSE: 100 mg/dL — AB (ref 65–99)
POTASSIUM: 4.1 mmol/L (ref 3.5–5.1)
Sodium: 138 mmol/L (ref 135–145)

## 2017-07-15 LAB — CBC
HEMATOCRIT: 32.7 % — AB (ref 39.0–52.0)
Hemoglobin: 11.4 g/dL — ABNORMAL LOW (ref 13.0–17.0)
MCH: 34.4 pg — ABNORMAL HIGH (ref 26.0–34.0)
MCHC: 34.9 g/dL (ref 30.0–36.0)
MCV: 98.8 fL (ref 78.0–100.0)
PLATELETS: 213 10*3/uL (ref 150–400)
RBC: 3.31 MIL/uL — AB (ref 4.22–5.81)
RDW: 14.6 % (ref 11.5–15.5)
WBC: 10.1 10*3/uL (ref 4.0–10.5)

## 2017-07-15 MED ORDER — MORPHINE SULFATE (PF) 2 MG/ML IV SOLN
1.0000 mg | Freq: Once | INTRAVENOUS | Status: AC
  Administered 2017-07-15: 1 mg via INTRAVENOUS
  Filled 2017-07-15: qty 1

## 2017-07-15 MED FILL — Vancomycin HCl For IV Soln 1 GM (Base Equivalent): INTRAVENOUS | Qty: 1000 | Status: AC

## 2017-07-15 NOTE — Anesthesia Postprocedure Evaluation (Signed)
Anesthesia Post Note  Patient: Mark Cordova  Procedure(s) Performed: RIGHT TOTAL KNEE ARTHROPLASTY (Right Knee)     Patient location during evaluation: PACU Anesthesia Type: Spinal Level of consciousness: awake and alert and oriented Pain management: pain level controlled Vital Signs Assessment: post-procedure vital signs reviewed and stable Respiratory status: nonlabored ventilation, respiratory function stable and spontaneous breathing Cardiovascular status: blood pressure returned to baseline and stable Postop Assessment: no headache, no backache, spinal receding, patient able to bend at knees and no apparent nausea or vomiting Anesthetic complications: no             Lonn Im A.

## 2017-07-15 NOTE — Progress Notes (Signed)
Orthopedic Tech Progress Note Patient Details:  Mark Cordova [birth year] [id number]  CPM Right Knee CPM Right Knee: On Right Knee Flexion (Degrees): 90 Right Knee Extension (Degrees): 0 Additional Comments: applied in pacu  Post Interventions Patient Tolerated: Well Instructions Provided: Care of device  [name] 07/15/2017, 3:10 PM

## 2017-07-15 NOTE — Progress Notes (Signed)
OT Cancellation Note  Patient Details Name: Mark Cordova MRN: [id number] DOB: 03/05/1951   Cancelled Treatment:    Reason Eval/Treat Not Completed: Other (comment); pt supine in bed upon entering room, reporting just recently gotten comfortable and requesting to rest at this time, declining EOB/OOB activity. Will follow up as schedule permits.  Lou Cal, OT Pager [phone] 07/15/2017   Raymondo Band 07/15/2017, 3:15 PM

## 2017-07-15 NOTE — Progress Notes (Signed)
Physical Therapy Treatment Patient Details Name: Mark Cordova MRN: [id number] DOB: 1951-03-06 Today's Date: 07/15/2017    History of Present Illness Pt is a 67 y/o male s/p elective R TKA. PMH includes R rotator cuff repair.     PT Comments    Patient limited by pain this session. Premedicated with IV morphine and pt agreeable to OOB mobility. Pt OOB in recliner end of session and pt reported feeling comfortable in chair and that nausea had subsided. Overall pt required supervision/min guard assist for bed mobility, transfer, and short distance gait. Continue to progress as tolerated.     Follow Up Recommendations  Follow surgeon's recommendation for DC plan and follow-up therapies;Supervision for mobility/OOB     Equipment Recommendations  Rolling walker with 5" wheels;3in1 (PT)    Recommendations for Other Services       Precautions / Restrictions Precautions Precautions: Knee Precaution Comments: knee precautions reviewed with pt Restrictions Weight Bearing Restrictions: Yes RLE Weight Bearing: Weight bearing as tolerated    Mobility  Bed Mobility Overal bed mobility: Needs Assistance Bed Mobility: Supine to Sit     Supine to sit: Supervision     General bed mobility comments: cues for technique; pt used L LE to bring R LE to EOB; supervision for safety and increased time needed; no use of bed rail  Transfers Overall transfer level: Needs assistance Equipment used: Rolling walker (2 wheeled) Transfers: Sit to/from Stand Sit to Stand: Min guard         General transfer comment: min guard for safety; cues for safe hand placement and use of AD  Ambulation/Gait Ambulation/Gait assistance: Min guard Ambulation Distance (Feet): 5 Feet Assistive device: Rolling walker (2 wheeled) Gait Pattern/deviations: Step-to pattern;Decreased stance time - right;Decreased step length - left;Decreased weight shift to right;Antalgic     General Gait Details: cues for  sequencing, posture, and proximity to [employer] Mobility    Modified Rankin (Stroke Patients Only)       Balance Overall balance assessment: Needs assistance Sitting-balance support: No upper extremity supported;Feet supported Sitting balance-Leahy Scale: Good     Standing balance support: Bilateral upper extremity supported;During functional activity Standing balance-Leahy Scale: Poor Standing balance comment: Reliant on BUE support.                             Cognition Arousal/Alertness: Awake/alert Behavior During Therapy: WFL for tasks assessed/performed Overall Cognitive Status: Within Functional Limits for tasks assessed                                        Exercises      General Comments        Pertinent Vitals/Pain Pain Assessment: 0-10 Pain Score: 8  Pain Location: R Knee Pain Descriptors / Indicators: Guarding;Sore Pain Intervention(s): Limited activity within patient's tolerance;Monitored during session;Premedicated before session;Repositioned;Ice applied    Home Living                      Prior Function            PT Goals (current goals can now be found in the care plan section) Acute Rehab PT Goals PT Goal Formulation: With patient Time For Goal Achievement: 07/28/17 Potential to Achieve Goals: Good Progress towards PT  goals: Progressing toward goals    Frequency    7X/week      PT Plan Current plan remains appropriate    Co-evaluation              AM-PAC PT "6 Clicks" Daily Activity  Outcome Measure  Difficulty turning over in bed (including adjusting bedclothes, sheets and blankets)?: A Little Difficulty moving from lying on back to sitting on the side of the bed? : A Little Difficulty sitting down on and standing up from a chair with arms (e.g., wheelchair, bedside commode, etc,.)?: Unable Help needed moving to and from a bed to chair (including a  wheelchair)?: A Little Help needed walking in hospital room?: A Little Help needed climbing 3-5 steps with a railing? : A Lot 6 Click Score: 15    End of Session Equipment Utilized During Treatment: Gait belt Activity Tolerance: Patient limited by pain Patient left: in chair;with call bell/phone within reach;Other (comment)(R LE in extension) Nurse Communication: Mobility status PT Visit Diagnosis: Other abnormalities of gait and mobility (R26.89);Muscle weakness (generalized) (M62.81);Unsteadiness on feet (R26.81)     Time: 8101-7510 PT Time Calculation (min) (ACUTE ONLY): 25 min  Charges:  $Gait Training: 8-22 mins $Therapeutic Activity: 8-22 mins                    G Codes:       Earney Navy, PTA Pager: [phone]     Darliss Cheney 07/15/2017, 1:41 PM

## 2017-07-15 NOTE — Progress Notes (Signed)
OT Cancellation Note  Patient Details Name: Mark Cordova MRN: [id number] DOB: [birth year]   Cancelled Treatment:    Reason Eval/Treat Not Completed: Other (comment); spoke with RN, pt with increased pain and n/v this AM, request OT hold until later today. Will follow up as schedule permits.  Lou Cal, OT Pager [phone] 07/15/2017   Raymondo Band 07/15/2017, 12:02 PM

## 2017-07-15 NOTE — Progress Notes (Signed)
Physical Therapy Treatment Patient Details Name: Mark Cordova MRN: [id number] DOB: [birth year] Today's Date: 07/15/2017    History of Present Illness Pt is a 67 y/o male s/p elective R TKA. PMH includes R rotator cuff repair.     PT Comments    Patient tolerated increased activity this session and is progressing toward PT goals. Pt reported a significant decrease in pain level compared to this am. Continue to progress as tolerated and attempt stairs next session.    Follow Up Recommendations  Follow surgeon's recommendation for DC plan and follow-up therapies;Supervision for mobility/OOB     Equipment Recommendations  Rolling walker with 5" wheels;3in1 (PT)    Recommendations for Other Services       Precautions / Restrictions Precautions Precautions: Knee Precaution Comments: knee precautions reviewed with pt Restrictions Weight Bearing Restrictions: Yes RLE Weight Bearing: Weight bearing as tolerated    Mobility  Bed Mobility Overal bed mobility: Modified Independent Bed Mobility: Supine to Sit     Supine to sit: Supervision     General bed mobility comments: increased time and effort  Transfers Overall transfer level: Needs assistance Equipment used: Rolling walker (2 wheeled) Transfers: Sit to/from Stand Sit to Stand: Min guard         General transfer comment: min guard for safety; cues for safe hand placement   Ambulation/Gait Ambulation/Gait assistance: Supervision;Min guard Ambulation Distance (Feet): 120 Feet Assistive device: Rolling walker (2 wheeled) Gait Pattern/deviations: Step-to pattern;Decreased stance time - right;Decreased step length - left;Decreased weight shift to right;Antalgic Gait velocity: decreased   General Gait Details: cues for R heel strike/toe off, sequencing, and R quad activation during stance phase as pt tends to maintain knee flexion    Stairs            Wheelchair Mobility    Modified Rankin (Stroke Patients  Only)       Balance Overall balance assessment: Needs assistance Sitting-balance support: No upper extremity supported;Feet supported Sitting balance-Leahy Scale: Good     Standing balance support: Bilateral upper extremity supported;During functional activity Standing balance-Leahy Scale: Poor Standing balance comment: Reliant on BUE support.                             Cognition Arousal/Alertness: Awake/alert Behavior During Therapy: WFL for tasks assessed/performed Overall Cognitive Status: Within Functional Limits for tasks assessed                                        Exercises Total Joint Exercises Quad Sets: AROM;Right;10 reps Heel Slides: AROM;Right;10 reps Hip ABduction/ADduction: AROM;Right;10 reps Goniometric ROM: 95 degrees flexion    General Comments        Pertinent Vitals/Pain Pain Assessment: Faces Pain Score: 8  Faces Pain Scale: Hurts little more Pain Location: R Knee Pain Descriptors / Indicators: Sore Pain Intervention(s): Monitored during session;Repositioned    Home Living Family/patient expects to be discharged to:: Private residence Living Arrangements: Spouse/significant other                  Prior Function            PT Goals (current goals can now be found in the care plan section) Acute Rehab PT Goals PT Goal Formulation: With patient Time For Goal Achievement: 07/28/17 Potential to Achieve Goals: Good Progress towards PT goals: Progressing toward goals  Frequency    7X/week      PT Plan Current plan remains appropriate    Co-evaluation              AM-PAC PT "6 Clicks" Daily Activity  Outcome Measure  Difficulty turning over in bed (including adjusting bedclothes, sheets and blankets)?: A Little Difficulty moving from lying on back to sitting on the side of the bed? : A Little Difficulty sitting down on and standing up from a chair with arms (e.g., wheelchair, bedside  commode, etc,.)?: Unable Help needed moving to and from a bed to chair (including a wheelchair)?: A Little Help needed walking in hospital room?: A Little Help needed climbing 3-5 steps with a railing? : A Little 6 Click Score: 16    End of Session Equipment Utilized During Treatment: Gait belt Activity Tolerance: Patient tolerated treatment well Patient left: in chair;with call bell/phone within reach Nurse Communication: Mobility status PT Visit Diagnosis: Other abnormalities of gait and mobility (R26.89);Muscle weakness (generalized) (M62.81);Unsteadiness on feet (R26.81)     Time: 1025-8527 PT Time Calculation (min) (ACUTE ONLY): 22 min  Charges:  Gait training 8-22 mins                    G Codes:       Earney Navy, PTA Pager: [phone]     Darliss Cheney 07/15/2017, 5:02 PM

## 2017-07-15 NOTE — Progress Notes (Signed)
Subjective: 1 Day Post-Op Procedure(s) (LRB): RIGHT TOTAL KNEE ARTHROPLASTY (Right) Patient reports pain as mild.  Doing well this am.    Objective: Vital signs in last 24 hours: Temp:  [98 F (36.7 C)-99.5 F (37.5 C)] 99.5 F (37.5 C) (02/28 0537) Pulse Rate:  [43-83] 73 (02/28 0537) Resp:  [11-19] 19 (02/28 0537) BP: (105-138)/(59-90) 118/72 (02/28 0537) SpO2:  [98 %-100 %] 100 % (02/28 0537) Weight:  [143 lb (64.9 kg)] 143 lb (64.9 kg) (02/27 1004)  Intake/Output from previous day: 02/27 0701 - 02/28 0700 In: 1300 [I.V.:1100; IV Piggyback:200] Out: 630 [Urine:600; Blood:30] Intake/Output this shift: No intake/output data recorded.  No results for input(s): HGB in the last 72 hours. No results for input(s): WBC, RBC, HCT, PLT in the last 72 hours. No results for input(s): NA, K, CL, CO2, BUN, CREATININE, GLUCOSE, CALCIUM in the last 72 hours. No results for input(s): LABPT, INR in the last 72 hours.  Neurologically intact Neurovascular intact Sensation intact distally Intact pulses distally Dorsiflexion/Plantar flexion intact Incision: dressing C/D/I No cellulitis present Compartment soft  Assessment/Plan: 1 Day Post-Op Procedure(s) (LRB): RIGHT TOTAL KNEE ARTHROPLASTY (Right) Advance diet Up with therapy D/C IV fluids Plan for discharge tomorrow home with  hhpt WBAT RLE Please remove ace bandage and apply ted hose  Aundra Dubin 07/15/2017, 7:35 AM

## 2017-07-15 NOTE — Progress Notes (Signed)
Pt with vomiting episodes x2, pt vomited after po pain medication given, MD notified and IV pain med ordered and given

## 2017-07-16 NOTE — Discharge Summary (Signed)
Patient ID: Mark Cordova MRN: [id number] DOB/AGE: Sep 23, 1950 67 y.o.  Admit date: 07/14/2017 Discharge date: 07/16/2017  Admission Diagnoses:  Active Problems:   Total knee replacement status   Discharge Diagnoses:  Same  Past Medical History:  Diagnosis Date  . Arthritis    knees  . Primary localized osteoarthritis of knee    Right    Surgeries: Procedure(s): RIGHT TOTAL KNEE ARTHROPLASTY on 07/14/2017   Consultants:   Discharged Condition: Improved  Hospital Course: Mark Cordova is an 67 y.o. male who was admitted 07/14/2017 for operative treatment of primary localized osteoarthritis right knee. Patient has severe unremitting pain that affects sleep, daily activities, and work/hobbies. After pre-op clearance the patient was taken to the operating room on 07/14/2017 and underwent  Procedure(s): RIGHT TOTAL KNEE ARTHROPLASTY.    Patient was given perioperative antibiotics:  Anti-infectives (From admission, onward)   Start     Dose/Rate Route Frequency Ordered Stop   07/14/17 1800  ceFAZolin (ANCEF) IVPB 2g/100 mL premix     2 g 200 mL/hr over 30 Minutes Intravenous Every 6 hours 07/14/17 1549 07/15/17 0625   07/14/17 1549  acyclovir (ZOVIRAX) tablet 400 mg     400 mg Oral 3 times daily PRN 07/14/17 1549     07/14/17 1036  ceFAZolin (ANCEF) IVPB 2g/100 mL premix     2 g 200 mL/hr over 30 Minutes Intravenous On call to O.R. 07/14/17 1036 07/14/17 1242       Patient was given sequential compression devices, early ambulation, and chemoprophylaxis to prevent DVT.  Patient benefited maximally from hospital stay and there were no complications.    Recent vital signs:  Patient Vitals for the past 24 hrs:  BP Temp Temp src Pulse Resp SpO2  07/16/17 0416 (!) 147/90 97.9 F (36.6 C) Oral 70 16 100 %  07/15/17 2015 (!) 155/79 98.4 F (36.9 C) Oral 60 16 100 %  07/15/17 1335 (!) 149/79 98.1 F (36.7 C) Oral 72 17 98 %     Recent laboratory studies:  Recent Labs     07/15/17 0612  WBC 10.1  HGB 11.4*  HCT 32.7*  PLT 213  NA 138  K 4.1  CL 105  CO2 23  BUN 13  CREATININE 0.82  GLUCOSE 100*  CALCIUM 8.4*     Discharge Medications:   Allergies as of 07/16/2017   No Known Allergies     Medication List    TAKE these medications   acyclovir 400 MG tablet Commonly known as:  ZOVIRAX Take 400 mg by mouth 3 (three) times daily as needed (outbreaks).   aspirin EC 325 MG tablet Take 1 tablet (325 mg total) by mouth 2 (two) times daily. What changed:    medication strength  how much to take  when to take this   ondansetron 4 MG tablet Commonly known as:  ZOFRAN Take 1-2 tablets (4-8 mg total) by mouth every 8 (eight) hours as needed for nausea or vomiting.   oxyCODONE 5 MG immediate release tablet Commonly known as:  Oxy IR/ROXICODONE Take 1-3 tablets (5-15 mg total) by mouth every 4 (four) hours as needed.   oxyCODONE 10 mg 12 hr tablet Commonly known as:  OXYCONTIN Take 1 tablet (10 mg total) by mouth every 12 (twelve) hours.   promethazine 25 MG tablet Commonly known as:  PHENERGAN Take 1 tablet (25 mg total) by mouth every 6 (six) hours as needed for nausea.   senna-docusate 8.6-50 MG tablet Commonly known as:  SENOKOT S Take 1 tablet by mouth at bedtime as needed.   tiZANidine 4 MG tablet Commonly known as:  ZANAFLEX Take 1 tablet (4 mg total) by mouth every 6 (six) hours as needed for muscle spasms.   Vitamin D 2000 units Caps Take 1 capsule by mouth daily.            Durable Medical Equipment  (From admission, onward)        Start     Ordered   07/14/17 1550  DME Walker rolling  Once    Question:  Patient needs a walker to treat with the following condition  Answer:  Total knee replacement status   07/14/17 1549   07/14/17 1550  DME 3 n 1  Once     07/14/17 1549   07/14/17 1550  DME Bedside commode  Once    Question:  Patient needs a bedside commode to treat with the following condition  Answer:  Total  knee replacement status   07/14/17 1549      Diagnostic Studies: Dg Chest 2 View  Result Date: 07/07/2017 CLINICAL DATA:  Preoperative evaluation for upcoming knee replacement EXAM: CHEST  2 VIEW COMPARISON:  None. FINDINGS: The heart size and mediastinal contours are within normal limits. Both lungs are mildly hyperinflated. The visualized skeletal structures are unremarkable. 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 Electronically Signed   By: Inez Catalina M.D.   On: 07/07/2017 15:58   Dg Knee Right Port  Result Date: 07/14/2017 CLINICAL DATA:  Osteoarthritis. Status post right total knee replacement. EXAM: PORTABLE RIGHT KNEE - 1-2 VIEW COMPARISON:  Radiographs dated 07/02/2017 FINDINGS: The patient has undergone right total knee replacement. The components appear in excellent position. Postsurgical air to the expected degree. No fractures. IMPRESSION: Satisfactory appearance of the right knee after total knee replacement. Electronically Signed   By: Lorriane Shire M.D.   On: 07/14/2017 15:35   Xr Knee 1-2 Views Right  Result Date: 07/02/2017 Advanced degenerative joint disease with valgus alignment   Disposition: 01-Home or Self Care    Follow-up Information    Leandrew Koyanagi, MD In 2 weeks.   Specialty:  Orthopedic Surgery Why:  For suture removal, For wound re-check Contact information: [place] [address] [phone]            Signed: Aundra Dubin 07/16/2017, 7:32 AM

## 2017-07-16 NOTE — Care Management Note (Signed)
Case Management Note  Patient Details  Name: Izaia Say MRN: [id number] Date of Birth: 1950-11-04  Subjective/Objective: 67 yr old male s/p right total knee arthroplasty.                    Action/Plan: Patient was preoperatively setup with [facility], no changes. Will have support at discharge.    Expected Discharge Date:  07/16/17               Expected Discharge Plan:  [place]  In-House Referral:  NA  Discharge planning Services  CM Consult  Post Acute Care Choice:  Durable Medical Equipment, Home Health Choice offered to:  Patient  DME Arranged:  Walker rolling(has 3in1) DME Agency:  [place]:  PT [place] Agency:  [facility] (formerly [facility]  Status of Service:  Completed, signed off  If discussed at [employer] of Stay Meetings, dates discussed:    Additional Comments:  Ninfa Meeker, RN 07/16/2017, 10:45 AM

## 2017-07-16 NOTE — Progress Notes (Signed)
Physical Therapy Treatment Patient Details Name: Mark Cordova MRN: [id number] DOB: Jul 22, 1950 Today's Date: 07/16/2017    History of Present Illness Pt is a 67 y/o male s/p elective R TKA. PMH includes R rotator cuff repair.     PT Comments    Patient is making good progress with PT.  From a mobility standpoint anticipate patient will be ready for DC home when medically ready.    Follow Up Recommendations  Follow surgeon's recommendation for DC plan and follow-up therapies;Supervision for mobility/OOB     Equipment Recommendations  Rolling walker with 5" wheels;3in1 (PT)    Recommendations for Other Services       Precautions / Restrictions Precautions Precautions: Knee Precaution Comments: knee precautions reviewed with pt Restrictions Weight Bearing Restrictions: Yes RLE Weight Bearing: Weight bearing as tolerated    Mobility  Bed Mobility Overal bed mobility: Independent                Transfers Overall transfer level: Modified independent Equipment used: Rolling walker (2 wheeled)                Ambulation/Gait Ambulation/Gait assistance: Modified independent (Device/Increase time) Ambulation Distance (Feet): 200 Feet Assistive device: Rolling walker (2 wheeled) Gait Pattern/deviations: Decreased weight shift to right;Step-through pattern;Decreased stride length;Trunk flexed Gait velocity: decreased   General Gait Details: cues for R heel strike and posture; improved step through pattern and R knee flexion during swing phase compared to previous session   Stairs Stairs: Yes   Stair Management: No rails;Step to pattern;Backwards;With walker Number of Stairs: (2 steps X2) General stair comments: cues for sequencing and technique; assist to stabilize RW  Wheelchair Mobility    Modified Rankin (Stroke Patients Only)       Balance Overall balance assessment: Needs assistance Sitting-balance support: No upper extremity supported;Feet  supported Sitting balance-Leahy Scale: Good     Standing balance support: Bilateral upper extremity supported;During functional activity Standing balance-Leahy Scale: Poor                              Cognition Arousal/Alertness: Awake/alert Behavior During Therapy: WFL for tasks assessed/performed Overall Cognitive Status: Within Functional Limits for tasks assessed                                        Exercises Total Joint Exercises Long Arc Quad: AROM;Right;10 reps;Seated Knee Flexion: AROM;AAROM;Right;10 reps;Seated(10 sec holds)    General Comments        Pertinent Vitals/Pain Pain Assessment: No/denies pain Pain Intervention(s): Monitored during session    Home Living                      Prior Function            PT Goals (current goals can now be found in the care plan section) Acute Rehab PT Goals PT Goal Formulation: With patient Time For Goal Achievement: 07/28/17 Potential to Achieve Goals: Good Progress towards PT goals: Progressing toward goals    Frequency    7X/week      PT Plan Current plan remains appropriate    Co-evaluation              AM-PAC PT "6 Clicks" Daily Activity  Outcome Measure  Difficulty turning over in bed (including adjusting bedclothes, sheets and blankets)?: None Difficulty moving from lying  on back to sitting on the side of the bed? : A Little Difficulty sitting down on and standing up from a chair with arms (e.g., wheelchair, bedside commode, etc,.)?: A Little Help needed moving to and from a bed to chair (including a wheelchair)?: A Little Help needed walking in hospital room?: A Little Help needed climbing 3-5 steps with a railing? : A Little 6 Click Score: 19    End of Session Equipment Utilized During Treatment: Gait belt Activity Tolerance: Patient tolerated treatment well Patient left: in chair;with call bell/phone within reach Nurse Communication: Mobility  status PT Visit Diagnosis: Other abnormalities of gait and mobility (R26.89);Muscle weakness (generalized) (M62.81);Unsteadiness on feet (R26.81)     Time: 9323-5573 PT Time Calculation (min) (ACUTE ONLY): 18 min  Charges:  $Gait Training: 8-22 mins                    G Codes:       Earney Navy, PTA Pager: [phone]     Darliss Cheney 07/16/2017, 9:05 AM

## 2017-07-16 NOTE — Evaluation (Signed)
Occupational Therapy Evaluation Patient Details Name: Mark Cordova MRN: [id number] DOB: Feb 01, 1951 Today's Date: 07/16/2017    History of Present Illness Pt is a 67 y/o male s/p elective R TKA. PMH includes R rotator cuff repair.    Clinical Impression   Patient evaluated by Occupational Therapy with no further acute OT needs identified. All education has been completed and the patient has no further questions. See below for any follow-up Occupational Therapy or equipment needs. OT to sign off. Thank you for referral.      Follow Up Recommendations  No OT follow up    Equipment Recommendations  None recommended by OT    Recommendations for Other Services       Precautions / Restrictions Precautions Precautions: Knee Precaution Comments: reviewed knee extension Restrictions RLE Weight Bearing: Weight bearing as tolerated      Mobility Bed Mobility Overal bed mobility: Independent                Transfers Overall transfer level: Independent                    Balance                                           ADL either performed or assessed with clinical judgement   ADL Overall ADL's : Modified independent                                       General ADL Comments: demonstrates tub transfer without DME required  Educated patient on knee full extension with return demonstration, educated tubr transfer,never to wash directly on incision site, always use fresh clean linen (one time use then place in laundry), avoid water under bandage and benefits of wrapping dressing, sleeping positioning, avoid putting pillow under knee Car transfer.     Vision Baseline Vision/History: No visual deficits       Perception     Praxis      Pertinent Vitals/Pain Pain Assessment: No/denies pain     Hand Dominance Right   Extremity/Trunk Assessment Upper Extremity Assessment Upper Extremity Assessment: Overall WFL for tasks  assessed   Lower Extremity Assessment Lower Extremity Assessment: Defer to PT evaluation   Cervical / Trunk Assessment Cervical / Trunk Assessment: Normal   Communication Communication Communication: No difficulties   Cognition Arousal/Alertness: Awake/alert Behavior During Therapy: WFL for tasks assessed/performed Overall Cognitive Status: Within Functional Limits for tasks assessed                                     General Comments  dressing dry and intact    Exercises     Shoulder Instructions      Home Living Family/patient expects to be discharged to:: Private residence Living Arrangements: Spouse/significant other Available Help at Discharge: Family;Available 24 hours/day Type of Home: House Home Access: Level entry     Home Layout: One level     Bathroom Shower/Tub: Teacher, early years/pre: Handicapped height     Home Equipment: Crutches;Cane - single point   Additional Comments: friend will be (A) upon d/c . reviewed SUV for transportation      Prior Functioning/Environment Level of Independence: Independent  OT Problem List:        OT Treatment/Interventions:      OT Goals(Current goals can be found in the care plan section) Acute Rehab OT Goals Patient Stated Goal: to leave today  OT Frequency:     Barriers to D/C:            Co-evaluation              AM-PAC PT "6 Clicks" Daily Activity     Outcome Measure Help from another person eating meals?: None Help from another person taking care of personal grooming?: None Help from another person toileting, which includes using toliet, bedpan, or urinal?: None Help from another person bathing (including washing, rinsing, drying)?: None Help from another person to put on and taking off regular upper body clothing?: None Help from another person to put on and taking off regular lower body clothing?: None 6 Click Score: 24   End of Session  Equipment Utilized During Treatment: Rolling walker CPM Right Knee CPM Right Knee: Off Nurse Communication: Mobility status;Precautions  Activity Tolerance: Patient tolerated treatment well Patient left: in bed;with call bell/phone within reach                   Time: 0925-0936 OT Time Calculation (min): 11 min Charges:  OT General Charges $OT Visit: 1 Visit OT Evaluation $OT Eval Low Complexity: 1 Low G-CodesJeri Modena   OTR/L Pager: [phone] Office: [phone] .   Parke Poisson B 07/16/2017, 1:05 PM

## 2017-07-16 NOTE — Progress Notes (Signed)
Subjective: 2 Days Post-Op Procedure(s) (LRB): RIGHT TOTAL KNEE ARTHROPLASTY (Right) Patient reports pain as mild.  Feeling great this am.  Objective: Vital signs in last 24 hours: Temp:  [97.9 F (36.6 C)-98.4 F (36.9 C)] 97.9 F (36.6 C) (03/01 0416) Pulse Rate:  [60-72] 70 (03/01 0416) Resp:  [16-17] 16 (03/01 0416) BP: (147-155)/(79-90) 147/90 (03/01 0416) SpO2:  [98 %-100 %] 100 % (03/01 0416)  Intake/Output from previous day: 02/28 0701 - 03/01 0700 In: 596 [P.O.:596] Out: 1350 [Urine:1350] Intake/Output this shift: No intake/output data recorded.  Recent Labs    07/15/17 0612  HGB 11.4*   Recent Labs    07/15/17 0612  WBC 10.1  RBC 3.31*  HCT 32.7*  PLT 213   Recent Labs    07/15/17 0612  NA 138  K 4.1  CL 105  CO2 23  BUN 13  CREATININE 0.82  GLUCOSE 100*  CALCIUM 8.4*   No results for input(s): LABPT, INR in the last 72 hours.  Neurologically intact Neurovascular intact Sensation intact distally Intact pulses distally Dorsiflexion/Plantar flexion intact Incision: dressing C/D/I No cellulitis present Compartment soft  Assessment/Plan: 2 Days Post-Op Procedure(s) (LRB): RIGHT TOTAL KNEE ARTHROPLASTY (Right) Advance diet Up with therapy Discharge home with home health after first session of PT as long as patient mobilizes well and feels comfortable being d/c WBAT RLE ABLA-mild and stable PLEASE APPLY TED HOSE TO BLE PRIOR TO D/C  Aundra Dubin 07/16/2017, 7:46 AM

## 2017-07-19 ENCOUNTER — Telehealth (INDEPENDENT_AMBULATORY_CARE_PROVIDER_SITE_OTHER): Payer: Self-pay | Admitting: Orthopaedic Surgery

## 2017-07-19 NOTE — Telephone Encounter (Signed)
yes

## 2017-07-19 NOTE — Telephone Encounter (Signed)
IC Maria and advised ok.

## 2017-07-19 NOTE — Telephone Encounter (Signed)
Verdis Frederickson PT with [facility] needs home health physical therapy verbal orders :  1 x for 1 week 3 x for 1 week 1 x for 1 week  CB # [phone]

## 2017-07-19 NOTE — Telephone Encounter (Signed)
Is this okay?

## 2017-07-22 ENCOUNTER — Telehealth (INDEPENDENT_AMBULATORY_CARE_PROVIDER_SITE_OTHER): Payer: Self-pay

## 2017-07-22 ENCOUNTER — Other Ambulatory Visit: Payer: Self-pay

## 2017-07-22 ENCOUNTER — Emergency Department (HOSPITAL_COMMUNITY)
Admission: EM | Admit: 2017-07-22 | Discharge: 2017-07-22 | Disposition: A | Discharge status: Home / Self Care | Payer: Medicare HMO | Attending: Emergency Medicine | Admitting: Emergency Medicine

## 2017-07-22 ENCOUNTER — Encounter (HOSPITAL_COMMUNITY): Payer: Self-pay | Admitting: Emergency Medicine

## 2017-07-22 ENCOUNTER — Telehealth (INDEPENDENT_AMBULATORY_CARE_PROVIDER_SITE_OTHER): Payer: Self-pay | Admitting: Orthopaedic Surgery

## 2017-07-22 DIAGNOSIS — R2240 Localized swelling, mass and lump, unspecified lower limb: Secondary | ICD-10-CM | POA: Insufficient documentation

## 2017-07-22 DIAGNOSIS — Z5321 Procedure and treatment not carried out due to patient leaving prior to being seen by health care provider: Secondary | ICD-10-CM | POA: Diagnosis not present

## 2017-07-22 NOTE — Telephone Encounter (Signed)
Pt called and lm on vm for triage and states that he went to the ER yesterday following a " reaction" but did not state what that was and was not seen in the ER. He wants an appt ASAP he is s/p a total knee replacement and would like you to call and discuss.

## 2017-07-22 NOTE — ED Triage Notes (Signed)
Pt states he just got a total knee replacement last week and now he noticed his food is getting very swollen. Pt denies any new injury.

## 2017-07-22 NOTE — ED Notes (Signed)
No answer when called for vitals x3

## 2017-07-22 NOTE — Telephone Encounter (Signed)
Ok if he has anymore concerns, he should come in

## 2017-07-22 NOTE — Telephone Encounter (Signed)
Anderson Malta from the [facility] called requesting the office notes from the 07/02/17 visit,  be sent to her via fax at [phone].  Her CB# [phone] ext [phone].  Thank you.

## 2017-07-22 NOTE — Telephone Encounter (Signed)
faxed

## 2017-07-22 NOTE — Telephone Encounter (Signed)
Called patient and states he is concerned about swelling and I advised him swelling is expected.He is 1 week post op.  He should be elevating leg above heart level. States he has not done so. Advised him he should ice it. His pain is not severe but advised taking pain meds as needed. Told patient I will let Dr Erlinda Hong know about what's going on.   FYIMartin Cordova to ED and spent 5-6 hours yesterday and walked out.    CB [phone]

## 2017-07-23 ENCOUNTER — Telehealth (INDEPENDENT_AMBULATORY_CARE_PROVIDER_SITE_OTHER): Payer: Self-pay

## 2017-07-23 NOTE — Telephone Encounter (Signed)
Ok can we try to get the [place] to move things along.

## 2017-07-23 NOTE — Telephone Encounter (Signed)
Sonia Side called and left voicemail states he has seen the patient a couple of visits . [facility] had not [facility] to continue [facility]. Not getting therapy. S/P Right TKA (07/14/17).  Just wanted to let you know. Is there anything we can do to expedite this. He doesn't want to delay things for patient. He cannot see patient without Authorization.

## 2017-07-26 NOTE — Telephone Encounter (Signed)
I called and left voice mail with [facility] rep to request assistance with this.

## 2017-07-27 ENCOUNTER — Ambulatory Visit (INDEPENDENT_AMBULATORY_CARE_PROVIDER_SITE_OTHER): Payer: Non-veteran care | Admitting: Orthopaedic Surgery

## 2017-07-27 ENCOUNTER — Encounter (INDEPENDENT_AMBULATORY_CARE_PROVIDER_SITE_OTHER): Payer: Self-pay | Admitting: Orthopaedic Surgery

## 2017-07-27 DIAGNOSIS — M1711 Unilateral primary osteoarthritis, right knee: Secondary | ICD-10-CM

## 2017-07-27 MED ORDER — OXYCODONE-ACETAMINOPHEN 5-325 MG PO TABS: 1.0000 | ORAL_TABLET | Freq: Two times a day (BID) | ORAL | 0 refills | Status: DC | PRN

## 2017-07-27 NOTE — Progress Notes (Signed)
2 week TKA follow up plan  Patient presents for 2 week follow up after right total knee replacement.  The incision is clean, dry, and intact and healing very well. There is no drainage, erythema, or signs of infection. Calf is nontender.  Moderate effusion.  Motion is progressing nicely.  We have encouraged continued use of TED hose as well as aspirin for DVT prophylaxis, and to continue with physical therapy exercises to work on strength and endurance. Patient is progressing well. Reminders were given about signs to be aware of including redness, drainage, increased pain, fevers, calf pain, shortness of breath, or any concern should generate a phone call or a return to see [place] immediately. Will plan to follow up at 6 weeks post op for next evaluation with radiographs at that time including 3 view xrays of the operative knee.  #30 percocet refilled

## 2017-08-04 ENCOUNTER — Ambulatory Visit: Payer: Medicare Other | Admitting: Adult Health

## 2017-08-24 ENCOUNTER — Ambulatory Visit: Payer: Medicare Other | Admitting: Adult Health

## 2017-08-24 DIAGNOSIS — Z0289 Encounter for other administrative examinations: Secondary | ICD-10-CM

## 2017-08-27 ENCOUNTER — Other Ambulatory Visit (INDEPENDENT_AMBULATORY_CARE_PROVIDER_SITE_OTHER): Payer: Self-pay | Admitting: Orthopaedic Surgery

## 2017-08-27 NOTE — Telephone Encounter (Signed)
Doesn't need it anymore

## 2017-08-27 NOTE — Telephone Encounter (Signed)
Ok for refill? 

## 2017-08-30 ENCOUNTER — Ambulatory Visit (INDEPENDENT_AMBULATORY_CARE_PROVIDER_SITE_OTHER): Payer: Non-veteran care | Admitting: Orthopaedic Surgery

## 2017-08-31 ENCOUNTER — Ambulatory Visit: Payer: Medicare HMO | Admitting: Adult Health

## 2017-09-13 ENCOUNTER — Ambulatory Visit (INDEPENDENT_AMBULATORY_CARE_PROVIDER_SITE_OTHER): Payer: Non-veteran care

## 2017-09-13 ENCOUNTER — Ambulatory Visit (INDEPENDENT_AMBULATORY_CARE_PROVIDER_SITE_OTHER): Payer: Non-veteran care | Admitting: Orthopaedic Surgery

## 2017-09-13 ENCOUNTER — Encounter (INDEPENDENT_AMBULATORY_CARE_PROVIDER_SITE_OTHER): Payer: Self-pay | Admitting: Orthopaedic Surgery

## 2017-09-13 DIAGNOSIS — Z96651 Presence of right artificial knee joint: Secondary | ICD-10-CM | POA: Insufficient documentation

## 2017-09-13 MED ORDER — AMOXICILLIN 500 MG PO CAPS: 2000.0000 mg | ORAL_CAPSULE | Freq: Once | ORAL | 6 refills | Status: AC

## 2017-09-13 NOTE — Progress Notes (Signed)
   Post-Op Visit Note   Patient: Jaquez Farrington           Date of Birth: Apr 23, 1951           MRN: [id number] Visit Date: 09/13/2017 PCP: Dorothyann Peng, NP   Assessment & Plan:  Chief Complaint:  Chief Complaint  Patient presents with  . Right Knee - Routine Post Op   Visit Diagnoses:  1. Presence of right artificial knee joint     Plan: 8 week TKA follow up plan  Patient presents for follow up 8 weeks status post right total knee replacement.  He is doing excellent.  Denies any pain.  walks without any devices.  Back to his normal level of activity.  The wound is healed and there is no evidence of infection. TED hose may be discontinued. Radiographs reveal a total knee arthroplasty in good position, with no evidence of subsidence, loosening, or complicating features. Patient should refrain from any dental procedures, colonoscopies, etc until 3 months postop.  Reminders were given about signs to be aware of including redness, drainage, increased pain, fevers, calf pain, shortness of breath, or any concern should generate a phone call or a return to see [place] immediately. Will plan to follow up at 3 months postoperatively for next evaluation.   Follow-Up Instructions: Return in about 6 weeks (around 10/25/2017).   Orders:  Orders Placed This Encounter  Procedures  . XR KNEE 3 VIEW RIGHT   Meds ordered this encounter  Medications  . amoxicillin (AMOXIL) 500 MG capsule    Sig: Take 4 capsules (2,000 mg total) by mouth once for 1 dose.    Dispense:  4 capsule    Refill:  6    Imaging: No results found.  PMFS History: Patient Active Problem List   Diagnosis Date Noted  . Presence of right artificial knee joint 09/13/2017  . Total knee replacement status 07/14/2017   Past Medical History:  Diagnosis Date  . Arthritis    knees  . Primary localized osteoarthritis of knee    Right    History reviewed. No pertinent family history.  Past Surgical History:  Procedure Laterality  Date  . COLONOSCOPY    . JOINT REPLACEMENT    . SHOULDER ARTHROSCOPY WITH ROTATOR CUFF REPAIR Right   . TOTAL KNEE ARTHROPLASTY Right 07/14/2017   Procedure: RIGHT TOTAL KNEE ARTHROPLASTY;  Surgeon: Leandrew Koyanagi, MD;  Location: [facility];  Service: Orthopedics;  Laterality: Right;   Social History   Occupational History  . Not on file  Tobacco Use  . Smoking status: Never Smoker  . Smokeless tobacco: Never Used  Substance and Sexual Activity  . Alcohol use: Yes    Comment: occasionally  . Drug use: Yes    Types: Marijuana    Comment: last time 2 weeks   . Sexual activity: Yes    Birth control/protection: None

## 2017-09-15 ENCOUNTER — Ambulatory Visit: Payer: Medicare HMO | Admitting: Adult Health

## 2017-09-24 ENCOUNTER — Ambulatory Visit: Payer: Non-veteran care | Admitting: Podiatry

## 2017-09-24 ENCOUNTER — Ambulatory Visit (INDEPENDENT_AMBULATORY_CARE_PROVIDER_SITE_OTHER): Payer: Medicare HMO | Admitting: Podiatry

## 2017-09-24 ENCOUNTER — Ambulatory Visit (INDEPENDENT_AMBULATORY_CARE_PROVIDER_SITE_OTHER): Payer: Non-veteran care

## 2017-09-24 ENCOUNTER — Encounter: Payer: Self-pay | Admitting: Podiatry

## 2017-09-24 VITALS — BP 145/85 | HR 50 | Resp 16

## 2017-09-24 DIAGNOSIS — M2041 Other hammer toe(s) (acquired), right foot: Secondary | ICD-10-CM

## 2017-09-24 DIAGNOSIS — M2042 Other hammer toe(s) (acquired), left foot: Secondary | ICD-10-CM | POA: Diagnosis not present

## 2017-09-24 DIAGNOSIS — M2010 Hallux valgus (acquired), unspecified foot: Secondary | ICD-10-CM

## 2017-09-24 NOTE — Patient Instructions (Signed)
Pre-Operative Instructions  Congratulations, you have decided to take an important step towards improving your quality of life.  You can be assured that the doctors and staff at [facility] will be with you every step of the way.  Here are some important things you should know:  1. Plan to be at the surgery center/hospital at least 1 (one) hour prior to your scheduled time, unless otherwise directed by the surgical center/hospital staff.  You must have a responsible adult accompany you, remain during the surgery and drive you home.  Make sure you have directions to the surgical center/hospital to ensure you arrive on time. 2. If you are having surgery at [facility] or [facility], you will need a copy of your medical history and physical form from your family physician within one month prior to the date of surgery. We will give you a form for your primary physician to complete.  3. We make every effort to accommodate the date you request for surgery.  However, there are times where surgery dates or times have to be moved.  We will contact you as soon as possible if a change in schedule is required.   4. No aspirin/ibuprofen for one week before surgery.  If you are on aspirin, any non-steroidal anti-inflammatory medications (Mobic, Aleve, Ibuprofen) should not be taken seven (7) days prior to your surgery.  You make take Tylenol for pain prior to surgery.  5. Medications - If you are taking daily heart and blood pressure medications, seizure, reflux, allergy, asthma, anxiety, pain or diabetes medications, make sure you notify the surgery center/hospital before the day of surgery so they can tell you which medications you should take or avoid the day of surgery. 6. No food or drink after midnight the night before surgery unless directed otherwise by surgical center/hospital staff. 7. No alcoholic beverages 24-hours prior to surgery.  No smoking 24-hours prior or 24-hours after  surgery. 8. Wear loose pants or shorts. They should be loose enough to fit over bandages, boots, and casts. 9. Don't wear slip-on shoes. Sneakers are preferred. 10. Bring your boot with you to the surgery center/hospital.  Also bring crutches or a walker if your physician has prescribed it for you.  If you do not have this equipment, it will be provided for you after surgery. 11. If you have not been contacted by the surgery center/hospital by the day before your surgery, call to confirm the date and time of your surgery. 12. Leave-time from work may vary depending on the type of surgery you have.  Appropriate arrangements should be made prior to surgery with your employer. 13. Prescriptions will be provided immediately following surgery by your doctor.  Fill these as soon as possible after surgery and take the medication as directed. Pain medications will not be refilled on weekends and must be approved by the doctor. 14. Remove nail polish on the operative foot and avoid getting pedicures prior to surgery. 15. Wash the night before surgery.  The night before surgery wash the foot and leg well with water and the antibacterial soap provided. Be sure to pay special attention to beneath the toenails and in between the toes.  Wash for at least three (3) minutes. Rinse thoroughly with water and dry well with a towel.  Perform this wash unless told not to do so by your physician.  Enclosed: 1 Ice pack (please put in freezer the night before surgery)   1 Hibiclens skin cleaner     Pre-op instructions  If you have any questions regarding the instructions, please do not hesitate to call our office.  [place]: [address], [place], [place] [address] -- [phone]  [place]: [address] [place], [place] [address] -- [phone]  [place]: [address]  [place], [place] [address] -- [phone]  [place]: [address], [address], [place], [place] [address] -- [phone]  Website:  [email or web address] 

## 2017-10-26 ENCOUNTER — Ambulatory Visit (INDEPENDENT_AMBULATORY_CARE_PROVIDER_SITE_OTHER): Payer: Non-veteran care | Admitting: Orthopaedic Surgery

## 2017-12-05 NOTE — Progress Notes (Signed)
Subjective:  Patient ID: Mark Cordova, male    DOB: 1951-04-10,  MRN: [id number]  Chief Complaint  Patient presents with  . Toe Pain    Toes 2-5 bilateral (R>L) - toes curling x years, surgery on right foot from [place] doc, still uncomfortable walking and with shoes  . New Patient (Initial Visit)   67 y.o. male presents with the above complaint.  Reports pain to both feet.  Present for many years.  Surgery.  Still uncomfortable walking with shoes. Pain in the big toe when the toe moves up and down.  Past Medical History:  Diagnosis Date  . Arthritis    knees  . Primary localized osteoarthritis of knee    Right   Past Surgical History:  Procedure Laterality Date  . COLONOSCOPY    . JOINT REPLACEMENT    . SHOULDER ARTHROSCOPY WITH ROTATOR CUFF REPAIR Right   . TOTAL KNEE ARTHROPLASTY Right 07/14/2017   Procedure: RIGHT TOTAL KNEE ARTHROPLASTY;  Surgeon: Leandrew Koyanagi, MD;  Location: [facility];  Service: Orthopedics;  Laterality: Right;    Current Outpatient Medications:  .  acyclovir (ZOVIRAX) 400 MG tablet, Take 400 mg by mouth 3 (three) times daily as needed (outbreaks)., Disp: , Rfl:  .  aspirin EC 325 MG tablet, Take 1 tablet (325 mg total) by mouth 2 (two) times daily., Disp: 84 tablet, Rfl: 0 .  Cholecalciferol (VITAMIN D) 2000 units CAPS, Take 1 capsule by mouth daily., Disp: , Rfl:  .  ondansetron (ZOFRAN) 4 MG tablet, Take 1-2 tablets (4-8 mg total) by mouth every 8 (eight) hours as needed for nausea or vomiting. (Patient not taking: Reported on 09/13/2017), Disp: 40 tablet, Rfl: 0 .  oxyCODONE (OXY IR/ROXICODONE) 5 MG immediate release tablet, Take 1-3 tablets (5-15 mg total) by mouth every 4 (four) hours as needed. (Patient not taking: Reported on 09/13/2017), Disp: 30 tablet, Rfl: 0 .  oxyCODONE (OXYCONTIN) 10 mg 12 hr tablet, Take 1 tablet (10 mg total) by mouth every 12 (twelve) hours., Disp: 10 tablet, Rfl: 0 .  oxyCODONE-acetaminophen (PERCOCET) 5-325 MG tablet, Take 1-2 tablets  by mouth 2 (two) times daily as needed for severe pain., Disp: 30 tablet, Rfl: 0 .  promethazine (PHENERGAN) 25 MG tablet, Take 1 tablet (25 mg total) by mouth every 6 (six) hours as needed for nausea., Disp: 30 tablet, Rfl: 1 .  senna-docusate (SENOKOT S) 8.6-50 MG tablet, Take 1 tablet by mouth at bedtime as needed., Disp: 30 tablet, Rfl: 1 .  tiZANidine (ZANAFLEX) 4 MG tablet, Take 1 tablet (4 mg total) by mouth every 6 (six) hours as needed for muscle spasms., Disp: 30 tablet, Rfl: 2  No Known Allergies Review of Systems: Negative except as noted in the HPI. Denies N/V/F/Ch. Objective:   Vitals:   09/24/17 1241  BP: (!) 145/85  Pulse: (!) 50  Resp: 16   General AA&O x3. Normal mood and affect.  Vascular Dorsalis pedis and posterior tibial pulses  present 2+ bilaterally  Capillary refill normal to all digits. Pedal hair growth normal.  Neurologic Epicritic sensation grossly present.  Dermatologic No open lesions. Interspaces clear of maceration. Nails well groomed and normal in appearance.  Orthopedic: MMT 5/5 in dorsiflexion, plantarflexion, inversion, and eversion. Normal joint ROM without pain or crepitus. Hammertoes lesser digits right foot 1st MPJ right. Pain on ROM.    Assessment & Plan:  Patient was evaluated and treated and all questions answered.  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 -Discussed possible surgical  correction of 1st MPJ arthritis and 2nd/3rd hammertoes.  -Consent form reviewed and signed by patient. -Plan for Right bunionectomy with implant, revision of right second hammertoe, correction right 3rd hammertoe.  Return for post op care after surgery.

## 2017-12-09 ENCOUNTER — Telehealth: Payer: Self-pay | Admitting: *Deleted

## 2017-12-09 NOTE — Telephone Encounter (Signed)
I called [facility] and I spoke to [place].  I explained to her that we needed to change the name of the physician on the referral.  She told me I needed to call the [facility] and ask Dr. Trecia Rogers to change the referral.  She said their phone number is [phone] ext. [phone].  I called the [facility].  I spoke to [place].  I explained the situation to her.  She told me I needed to call [facility] at the [facility].  I explained to her that I was told by [facility] to call them.  She said the change had to be made by the referring doctor.  She told me that was not think that was the case but she said she would give Dr. Carie Caddy the message and someone would call me back.

## 2017-12-09 NOTE — Telephone Encounter (Signed)
I called and spoke to Garfield at [facility].  She said the surgery had been authorized but the initial referral was made to Dr. Celesta Gentile.  She said she put in a request to have the referral changed to Dr. Hardie Pulley but stated I would have to call the [facility] and put in the request as well.  I told her I would give them a call.

## 2017-12-16 ENCOUNTER — Ambulatory Visit: Payer: Non-veteran care | Admitting: Podiatry

## 2017-12-23 ENCOUNTER — Ambulatory Visit: Payer: Non-veteran care | Admitting: Podiatry

## 2018-01-24 ENCOUNTER — Telehealth: Payer: Self-pay | Admitting: Podiatry

## 2018-01-24 NOTE — Telephone Encounter (Signed)
[facility] would like to have notes faxed to [phone] Attn: Octavia Bruckner

## 2018-02-16 ENCOUNTER — Encounter (INDEPENDENT_AMBULATORY_CARE_PROVIDER_SITE_OTHER): Payer: Self-pay

## 2018-02-16 ENCOUNTER — Ambulatory Visit (INDEPENDENT_AMBULATORY_CARE_PROVIDER_SITE_OTHER): Payer: Self-pay | Admitting: Orthopaedic Surgery

## 2018-09-14 ENCOUNTER — Ambulatory Visit (INDEPENDENT_AMBULATORY_CARE_PROVIDER_SITE_OTHER): Payer: Medicare HMO | Admitting: Family Medicine

## 2018-09-14 ENCOUNTER — Encounter: Payer: Self-pay | Admitting: Family Medicine

## 2018-09-14 ENCOUNTER — Other Ambulatory Visit: Payer: Self-pay

## 2018-09-14 DIAGNOSIS — Z0000 Encounter for general adult medical examination without abnormal findings: Secondary | ICD-10-CM

## 2018-09-14 NOTE — Progress Notes (Signed)
   Subjective:    Patient ID: Mark Cordova, male    DOB: 1950/08/10, 68 y.o.   MRN: [id number]  HPI This was an error    Review of Systems     Objective:   Physical Exam        Assessment & Plan:

## 2018-09-15 ENCOUNTER — Ambulatory Visit: Payer: Medicare HMO | Admitting: Family Medicine

## 2018-09-16 ENCOUNTER — Ambulatory Visit: Payer: Medicare HMO | Admitting: Family Medicine

## 2018-09-20 ENCOUNTER — Ambulatory Visit: Payer: Medicare HMO | Admitting: Family Medicine

## 2018-10-18 DIAGNOSIS — R69 Illness, unspecified: Secondary | ICD-10-CM | POA: Diagnosis not present

## 2018-11-17 DIAGNOSIS — R69 Illness, unspecified: Secondary | ICD-10-CM | POA: Diagnosis not present

## 2018-12-21 ENCOUNTER — Telehealth: Payer: Self-pay | Admitting: *Deleted

## 2018-12-21 NOTE — Telephone Encounter (Signed)
Copied from [place] [id number]. Topic: Appointment Scheduling - Scheduling Inquiry for Clinic >> Dec 14, 2018  4:53 PM Erick Blinks wrote: Reason for CRM: Pt called requesting new pt appt. Tried calling office. Pt also stated that he has been having ear aches in his right ear for a few months now, and that he has a knot on his stomach that keeps getting bigger.  He also wants to schedule a CPE >> Dec 21, 2018  4:02 PM Mcneil, Jacinto Reap wrote: Pt stated he has been trying to get a new pt appt scheduled. Pt stated the appt with Dr. Sarajane Jews was cancelled but he would like a call back to schedule an appt with a provider.  >> Dec 21, 2018  3:34 PM Bella Kennedy C wrote: Duplicate message

## 2018-12-22 ENCOUNTER — Telehealth: Payer: Self-pay | Admitting: Family Medicine

## 2018-12-22 NOTE — Telephone Encounter (Signed)
Please advise if patient can reschedule New Patient appointment. They were a no show on 09/20/2018.  Copied from [place] [id number]. Topic: Appointment Scheduling - Scheduling Inquiry for Clinic >> Dec 14, 2018  4:53 PM Erick Blinks wrote: Reason for CRM: Pt called requesting new pt appt. Tried calling office. Pt also stated that he has been having ear aches in his right ear for a few months now, and that he has a knot on his stomach that keeps getting bigger.  He also wants to schedule a CPE >> Dec 21, 2018  4:02 PM Mcneil, Jacinto Reap wrote: Pt stated he has been trying to get a new pt appt scheduled. Pt stated the appt with Dr. Sarajane Jews was cancelled but he would like a call back to schedule an appt with a provider.  >> Dec 21, 2018  3:34 PM Bella Kennedy C wrote: Duplicate message

## 2018-12-22 NOTE — Telephone Encounter (Signed)
We can see him, but the first visit should be to establish and to address the ear and abdomen complaints. We would need to do a complete physical at another visit

## 2018-12-22 NOTE — Telephone Encounter (Signed)
I tried to call the patient to schedule a new patient appointment with Dr. Sarajane Jews but the patient's voicemail is not set up to leave a voice mail.

## 2018-12-22 NOTE — Telephone Encounter (Signed)
Please advise, Dr. Sarajane Jews has agreed to seeing the patient and having them reschedule however, I do not have access to override his schedule due to not accepting currently.

## 2018-12-28 NOTE — Telephone Encounter (Signed)
Called pt and he stated he didn't want a male dr.  All dr's taking new patients are male.

## 2019-07-20 ENCOUNTER — Telehealth: Payer: Self-pay | Admitting: Family Medicine

## 2019-07-20 NOTE — Telephone Encounter (Signed)
Pt would like to know if Sarajane Jews will accept him as a new pt?  Informed them that Sarajane Jews is not accepting new pt at this time. They wanted me to ask if he would take him.  Pt can be reached at [phone] or [phone]

## 2019-07-21 NOTE — Telephone Encounter (Signed)
Left message for patient to call back  

## 2019-07-21 NOTE — Telephone Encounter (Signed)
He is not new, he is already my patient. I gave him a physical last April

## 2019-07-28 NOTE — Telephone Encounter (Signed)
Called patient and scheduled appointment for 09/15/2019. Nothing further needed at this time.

## 2019-09-15 ENCOUNTER — Other Ambulatory Visit: Payer: Self-pay

## 2019-09-15 ENCOUNTER — Ambulatory Visit: Payer: Medicare HMO | Admitting: Family Medicine

## 2019-09-16 IMAGING — DX DG KNEE 1-2V PORT*R*
1 series · 2 of 2 positions shown · non-contrast
Comparison: Radiographs dated 07/02/2017

CLINICAL DATA: Osteoarthritis. Status post right total knee
replacement.

EXAM:
PORTABLE RIGHT KNEE - 1-2 VIEW

[Series 1: knee · 0.14mm/px · 2 of 2 slices shown]
[im 1/2]
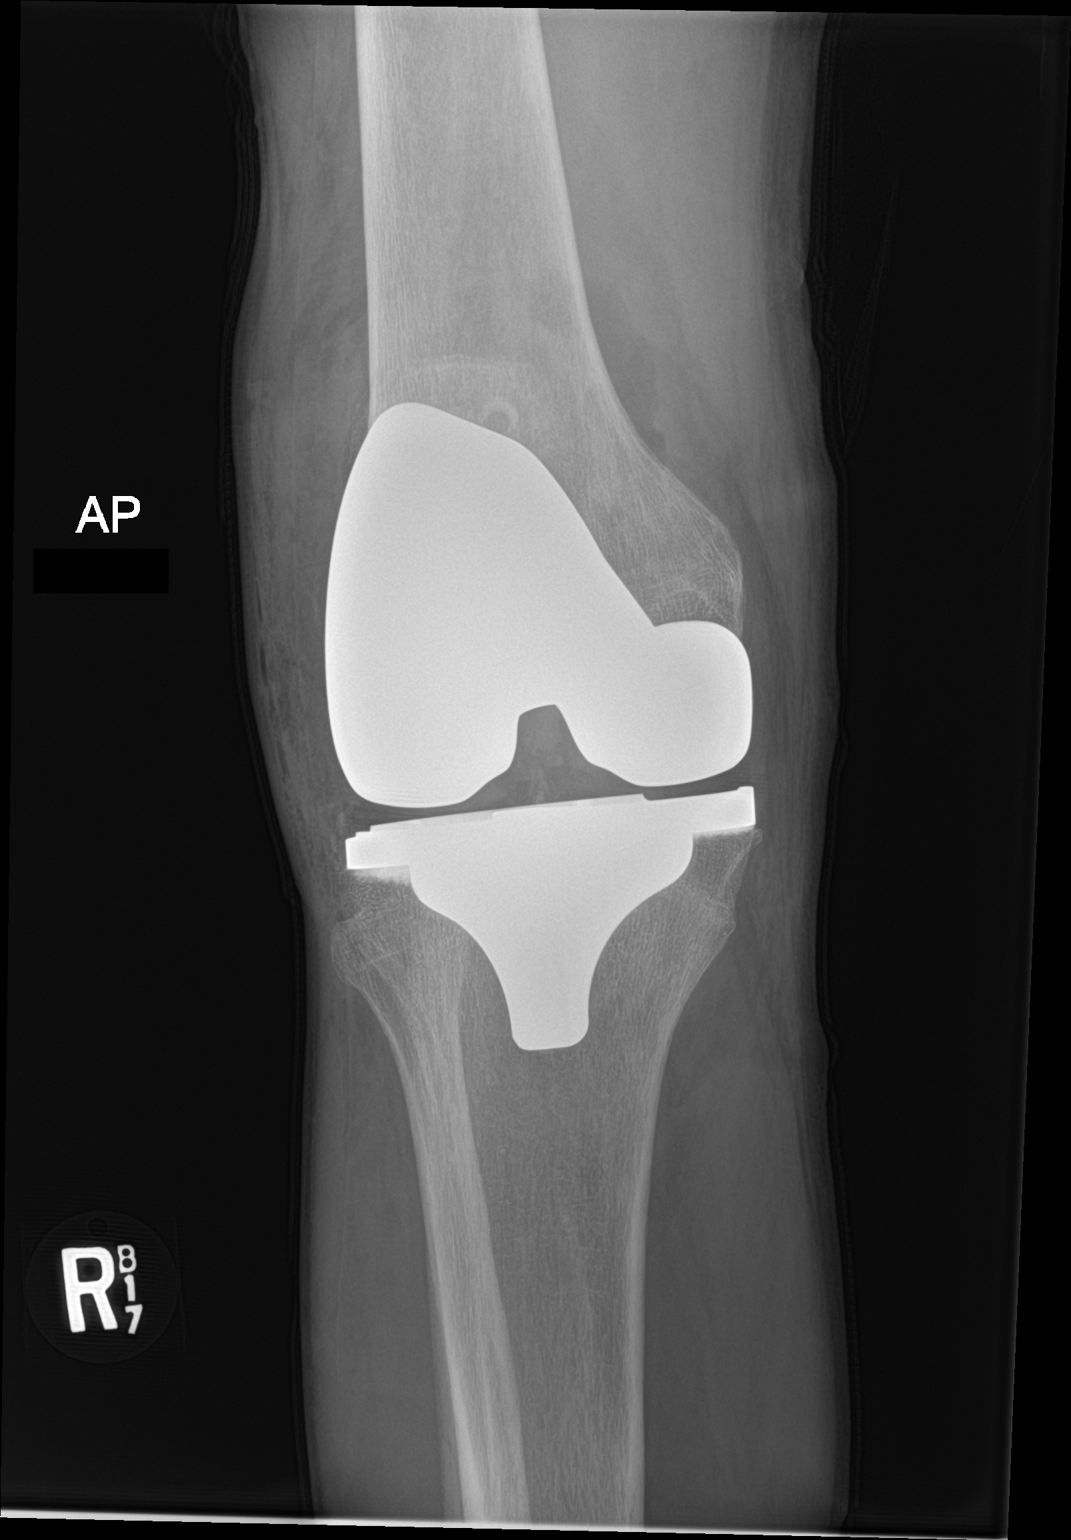
[im 2/2]
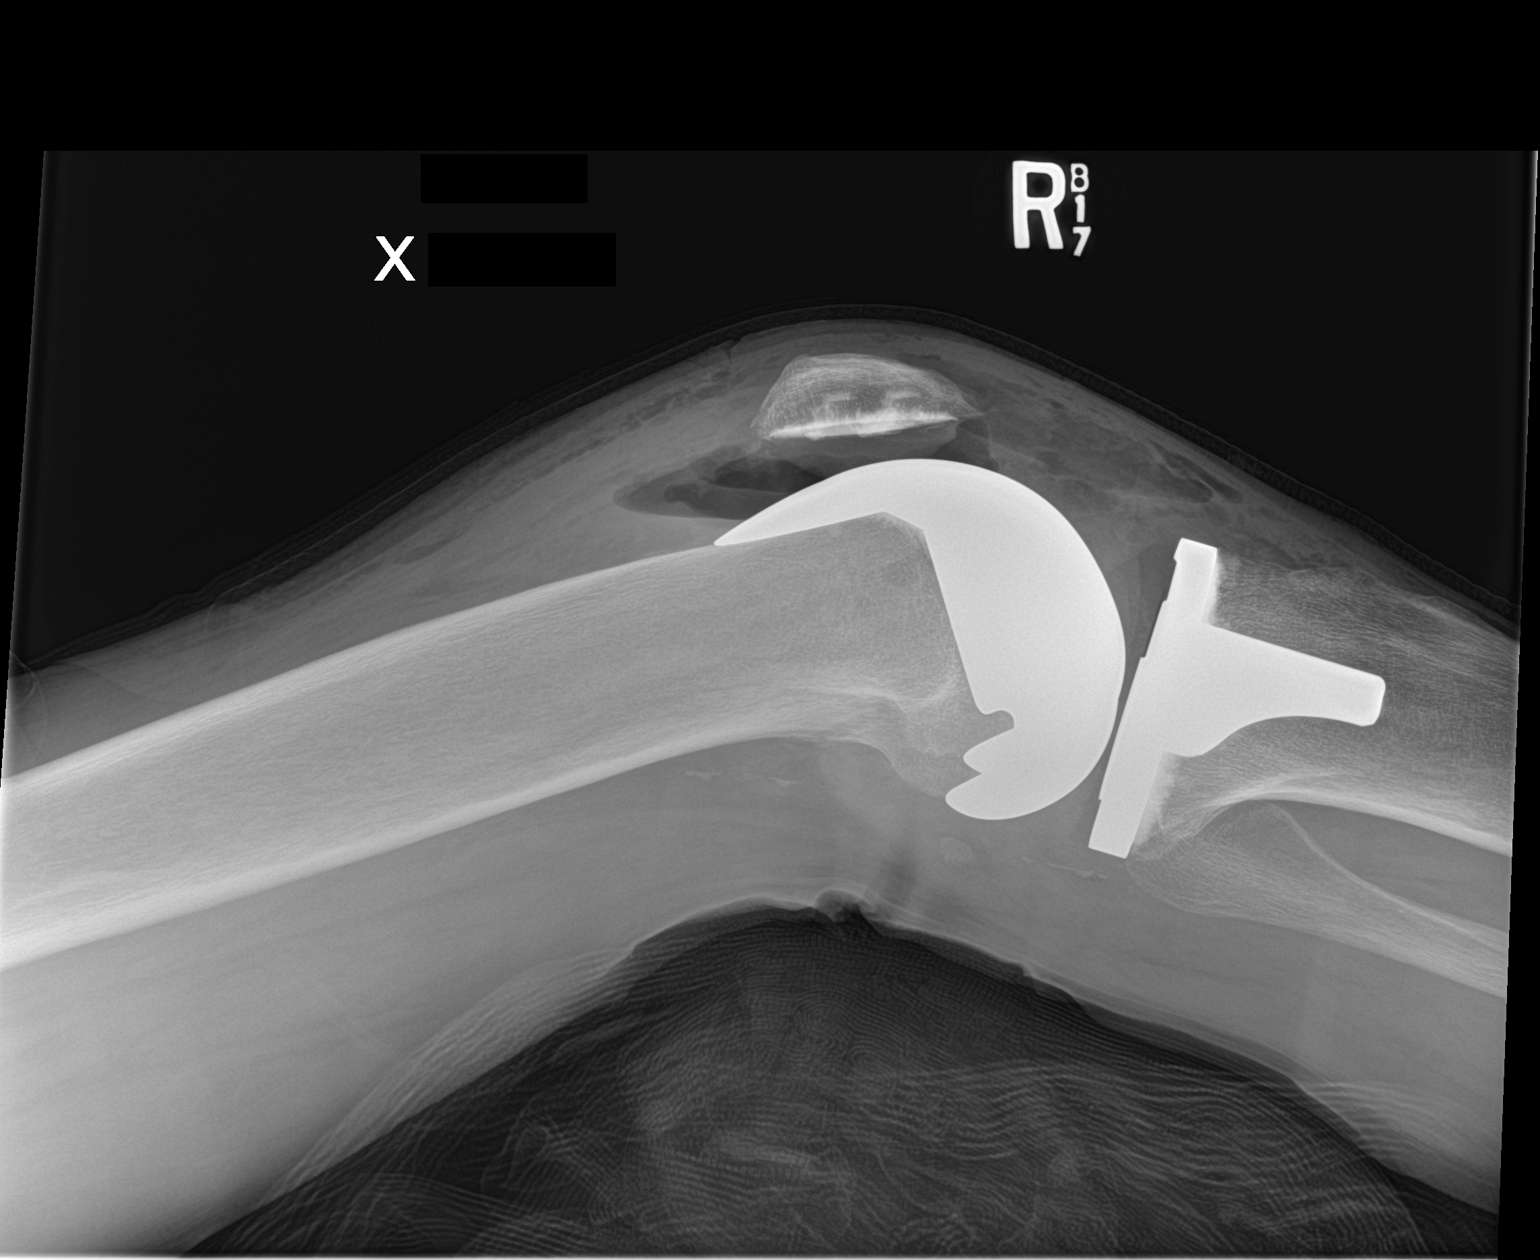

[2 of 2 positions shown; findings below may reference images not displayed]

FINDINGS: The patient has undergone right total knee replacement. The
components appear in excellent position. Postsurgical air to the
expected degree. No fractures.
IMPRESSION: Satisfactory appearance of the right knee after total knee
replacement.

## 2019-09-19 ENCOUNTER — Other Ambulatory Visit: Payer: Self-pay

## 2019-09-19 ENCOUNTER — Encounter: Payer: Self-pay | Admitting: Family Medicine

## 2019-09-19 ENCOUNTER — Ambulatory Visit (INDEPENDENT_AMBULATORY_CARE_PROVIDER_SITE_OTHER): Payer: Medicare (Managed Care) | Admitting: Family Medicine

## 2019-09-19 DIAGNOSIS — Z0000 Encounter for general adult medical examination without abnormal findings: Secondary | ICD-10-CM

## 2019-09-19 NOTE — Progress Notes (Signed)
Medicare Annual Preventive Care Visit  (initial annual wellness or annual wellness exam)  Virtual Visit via Video Note  I connected with [name]  on 09/25/19 by phone  Location patient: home Location provider:work or home office Persons participating in the virtual visit: patient, provider  Concerns and/or follow up today:  Reports a PMH of only chronic medical problem of Herpes and takes Acyclovir. Takes Vit D per his [place] doctor and ASA 81mg  - denies any heart issues. Reports gets regular exercise - walks and has a gym at home. Reports eats healthy he feels great. Had some dental surgery in the last 1 year.  See HM section in Epic for other details of completed HM. See scanned documentation under Media Tab for further documentation HPI, health risk assessment. See Media Tab and Care Teams sections in Epic for other providers. [facility] Wynona Meals (reports last seen about 2 years ago) No Tobacco. Alcohol a few drinks a few days per week.  ROS: negative for report of fevers, unintentional weight loss, vision changes, vision loss, hearing loss or change, chest pain, sob, hemoptysis, melena, hematochezia, hematuria, genital discharge or lesions, falls, bleeding or bruising, loc, thoughts of suicide or self harm, memory loss  1.) Patient-completed health risk assessment  - completed and reviewed, see scanned documentation  2.) Review of Medical History: -PMH, PSH, Family History and current specialty and care providers reviewed and updated and listed below  - see scanned in document in chart and below Patient Care Team: Laurey Morale, MD as PCP - General (Family Medicine)   Past Medical History:  Diagnosis Date  . Arthritis    knees  . Primary localized osteoarthritis of knee    Right    Past Surgical History:  Procedure Laterality Date  . COLONOSCOPY    . JOINT REPLACEMENT    . SHOULDER ARTHROSCOPY WITH ROTATOR CUFF REPAIR Right   . TOTAL KNEE ARTHROPLASTY Right  07/14/2017   Procedure: RIGHT TOTAL KNEE ARTHROPLASTY;  Surgeon: Leandrew Koyanagi, MD;  Location: [facility];  Service: Orthopedics;  Laterality: Right;    Social History   Socioeconomic History  . Marital status: Single    Spouse name: Not on file  . Number of children: Not on file  . Years of education: Not on file  . Highest education level: Not on file  Occupational History  . Not on file  Tobacco Use  . Smoking status: Never Smoker  . Smokeless tobacco: Never Used  Substance and Sexual Activity  . Alcohol use: Yes    Comment: occasionally  . Drug use: Yes    Types: Marijuana    Comment: last time 2 weeks   . Sexual activity: Yes    Birth control/protection: None  Other Topics Concern  . Not on file  Social History Narrative  . Not on file   Social Determinants of Health   Financial Resource Strain:   . Difficulty of Paying Living Expenses:   Food Insecurity:   . Worried About Charity fundraiser in the Last Year:   . Arboriculturist in the Last Year:   Transportation Needs:   . Film/video editor (Medical):   Marland Kitchen Lack of Transportation (Non-Medical):   Physical Activity:   . Days of Exercise per Week:   . Minutes of Exercise per Session:   Stress:   . Feeling of Stress :   Social Connections:   . Frequency of Communication with Friends and Family:   .  Frequency of Social Gatherings with Friends and Family:   . Attends Religious Services:   . Active Member of Clubs or Organizations:   . Attends Archivist Meetings:   Marland Kitchen Marital Status:   Intimate Partner Violence:   . Fear of Current or Ex-Partner:   . Emotionally Abused:   Marland Kitchen Physically Abused:   . Sexually Abused:     History reviewed. No pertinent family history.  Current Outpatient Medications on File Prior to Visit  Medication Sig Dispense Refill  . acyclovir (ZOVIRAX) 400 MG tablet Take 400 mg by mouth 3 (three) times daily as needed (outbreaks).    Marland Kitchen aspirin EC 325 MG tablet Take 1 tablet  (325 mg total) by mouth 2 (two) times daily. (Patient taking differently: Take 325 mg by mouth as needed. ) 84 tablet 0  . Cholecalciferol (VITAMIN D) 2000 units CAPS Take 1 capsule by mouth daily.     No current facility-administered medications on file prior to visit.     3.) Review of functional ability and level of safety:  Any difficulty hearing?  See scanned documentation  History of falling?  See scanned documentation  Any trouble with IADLs - using a phone, using transportation, grocery shopping, preparing meals, doing housework, doing laundry, taking medications and managing money?  See scanned documentation  Advance Directives?  Discussed briefly and offered more resources.  See summary of recommendations in Patient Instructions below.  4.) Physical Exam There were no vitals filed for this visit. Estimated body mass index is 19.39 kg/m as calculated from the following:   Height as of 07/22/17: 6' (1.829 m).   Weight as of 07/22/17: 143 lb (64.9 kg).  General: no audible sounds of distress  Psych: pleasant and cooperative, speech and thought processing grossly intact  Depression screen PHQ 2/9 09/19/2019  Decreased Interest 0  Down, Depressed, Hopeless 0  PHQ - 2 Score 0    See patient instructions for recommendations.  Education and counseling regarding the above review of health provided with a plan for the following: -see scanned patient completed form for further details -fall prevention strategies - denies any issues with ambulation, falls or balance -healthy lifestyle discussed -importance and resources for completing advanced directives discussed -see patient instructions below for any other recommendations provided  4)The following written screening schedule of preventive measures were reviewed with assessment and plan made per below, orders and patient instructions:      AAA screening done if applicable     Alcohol screening done - advised no more than  2 drinks in a given day and limiting alcohol/risks     Obesity Screening and counseling done     STI screening (Hep C if born 1947-65) offered and per pt wishes - declined, he thinks he had Hep c screening and will [facility] with [facility].     Tobacco Screening done        Pneumococcal (PPSV23 -one dose after 64, one before if risk factors), influenza yearly and hepatitis B vaccines (if high risk - end stage renal disease, IV drugs, homosexual men, live in home for mentally retarded, hemophilia receiving factors) ASSESSMENT/PLAN: he thinks did with [facility] and agrees to check      Prostate cancer screening ASSESSMENT/PLAN: n/a, declined - reports his current PCP at the [place] is very thorough and believes he is up to date. Agrees to bring labs to new PCP visit with [place], Dr. Sarajane Jews      Colorectal cancer screening (FOBT yearly or  flex sig q4y or colonoscopy q10y or barium enema q4y) ASSESSMENT/PLAN: reports utd and per Epic utd, I do not see report in the chart. Requested that he bring copy of colonoscopy report to his [facility] PCP.      Diabetes outpatient self-management training services ASSESSMENT/PLAN: n/a      Screening for glaucoma(q1y if high risk - diabetes, FH, AA and > 50 or hispanic and > 65) ASSESSMENT/PLAN: utd or advised      Medical nutritional therapy for individuals with diabetes or renal disease ASSESSMENT/PLAN: n/a      Cardiovascular screening blood tests (lipids q5y) ASSESSMENT/PLAN: : he needs [place] PCP and wishes to come for in person visit and do labs at that time      Diabetes screening tests ASSESSMENT/PLAN: : he needs [facility] PCP and wishes to come for in person visit and do labs at that time   7.) Summary:   Medicare annual wellness visit, subsequent -risk factors and conditions per above assessment were discussed and treatment, recommendations and referrals were offered per documentation above and orders and patient instructions. -he thinks he probably had the  pneumonia vaccines and hep C screening - he agrees to get records -congratulated on healthy diet, exercise and COVID19 vaccination. -Discussed alcohol use and advise no more than 2 drinks in a given day and discussed risks. -Pt sees PCP at [facility], but wishes to see Dr. Ardelle Anton for a new pt visit. I don't think he has seen Dr. Sarajane Jews, even though he is listed as the PCP. Sent message to front office Tanya Buckson and schedulers to assist. He wishes to do any needed labs at that visit. Advised if he could bring vaccine record, last few sets of labs, PSA, hep C, colonoscopy report and any other imgaing or studies to that visit it would be helpful for his new PCP.   There are no Patient Instructions on file for this visit.  [name], DO

## 2019-09-19 NOTE — Patient Instructions (Addendum)
  Mark Cordova , Thank you for taking time to come for your Medicare Wellness Visit. I appreciate your ongoing commitment to your health goals. Please review the following plan we discussed and let me know if I can assist you in the future.   These are the goals we discussed: Goals   -schedule appointment with Dr. Sarajane Jews. Please bring last few sets of labs, vaccine record, last colonoscopy report, last few PSA results, hep C result to that appointment if you can. -Continue a healthy diet and regular exercise -please minimize alcohol use to no more than 2 drinks in a 24 hour period   This is a list of the screening recommended for you and due dates:  Health Maintenance  Topic Date Due  .  Hepatitis C: One time screening is recommended by Center for Disease Control  (CDC) for  adults born from [birth year] through 1967.   Never done  . Pneumonia vaccines (2 of 2 - PPSV23) 05/19/2015  . Flu Shot  12/17/2019  . Colon Cancer Screening  05/18/2024  . Tetanus Vaccine  05/18/2024  . COVID-19 Vaccine  Completed

## 2019-10-17 ENCOUNTER — Other Ambulatory Visit: Payer: Self-pay

## 2019-10-18 ENCOUNTER — Ambulatory Visit (INDEPENDENT_AMBULATORY_CARE_PROVIDER_SITE_OTHER): Payer: Medicare (Managed Care) | Admitting: Family Medicine

## 2019-10-18 ENCOUNTER — Encounter: Payer: Self-pay | Admitting: Family Medicine

## 2019-10-18 VITALS — BP 120/70 | HR 69 | Temp 98.0°F | Ht 72.0 in | Wt 136.2 lb

## 2019-10-18 DIAGNOSIS — A6000 Herpesviral infection of urogenital system, unspecified: Secondary | ICD-10-CM

## 2019-10-18 DIAGNOSIS — D179 Benign lipomatous neoplasm, unspecified: Secondary | ICD-10-CM

## 2019-10-18 DIAGNOSIS — N509 Disorder of male genital organs, unspecified: Secondary | ICD-10-CM | POA: Diagnosis not present

## 2019-10-18 DIAGNOSIS — N50819 Testicular pain, unspecified: Secondary | ICD-10-CM

## 2019-10-18 NOTE — Progress Notes (Signed)
   Subjective:    Patient ID: Mark Cordova, male    DOB: [birth year], 69 y.o.   MRN: [id number]  HPI Here to establish with [place] and for several issues. He has been seeing the [place] clinic for primary care but wants to switch to [place]. He asks about 2 lumps under the skin that came up years ago, one on the left upper arm and another on the abdomen. They are slowly enlarging but they are not painful. 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 No hx of trauma. The [place] doctor could not explain it, and he has had no relief with taking antibiotics. It does not swell. No trouble with urinating. He has never seen a Dealer for this. He is retired from working as a Geneticist, molecular and an Passenger transport manager. Prior to that he served 8 years in the [employer]. He says he lost a lot of weight in the last 2 years because he could not eat. He had bad teeth and these were finally removed. He was fitted with dentures last winter, and he is able to eat again and is gaining some of the weight back. His last physical with lab work was at the [place] last fall.    Review of Systems  Constitutional: Negative.   Respiratory: Negative.   Cardiovascular: Negative.   Gastrointestinal: Negative for abdominal distention, abdominal pain, blood in stool, constipation, diarrhea and nausea.  Genitourinary: Positive for testicular pain. Negative for discharge, frequency, hematuria, penile pain, scrotal swelling and urgency.       Objective:   Physical Exam Constitutional:      Comments: Very thin   Cardiovascular:     Rate and Rhythm: Normal rate and regular rhythm.     Pulses: Normal pulses.     Heart sounds: Normal heart sounds.  Pulmonary:     Effort: Pulmonary effort is normal.     Breath sounds: Normal breath sounds.  Abdominal:     General: Abdomen is flat. Bowel sounds are normal. There is no distension.     Tenderness: There is no abdominal tenderness. There is no guarding or rebound.     Hernia:  No hernia is present.     Comments: There is a small firm mobile non-tender mass under the skin just superior to the umbilicus.   Genitourinary:    Comments: The testicles appear normal with no swelling or masses noted. However the right testicle is quite tender  Skin:    Comments: There is a small non-tender firm mass under the skin of the left upper arm  Neurological:     Mental Status: He is alert.           Assessment & Plan:  He has chronic right testicular pain, and we will refer him to Urology to evaluate. He also has 2 benign lipomas, one on the left upper arm and the other on the middle abdomen. These are benign and no treatment is needed.  Alysia Penna, MD

## 2019-11-29 ENCOUNTER — Telehealth: Payer: Self-pay | Admitting: Family Medicine

## 2019-11-29 NOTE — Telephone Encounter (Signed)
Pt is requesting a refill on Acyclovir 800 mg tablet. Pt uses [facility]

## 2019-11-29 NOTE — Telephone Encounter (Signed)
Please advise. This was last filled by a historical provider.

## 2019-11-30 MED ORDER — ACYCLOVIR 800 MG PO TABS: 800.0000 mg | ORAL_TABLET | Freq: Every day | ORAL | 5 refills | Status: DC

## 2019-11-30 NOTE — Telephone Encounter (Signed)
Spoke with the patient. He is aware that his prescriptions have been sent in.

## 2019-11-30 NOTE — Addendum Note (Signed)
Addended by: Alysia Penna A on: 11/30/2019 12:22 PM   Modules accepted: Orders

## 2019-11-30 NOTE — Telephone Encounter (Signed)
Done

## 2020-03-05 ENCOUNTER — Telehealth (INDEPENDENT_AMBULATORY_CARE_PROVIDER_SITE_OTHER): Payer: Medicare (Managed Care) | Admitting: Family Medicine

## 2020-03-05 ENCOUNTER — Other Ambulatory Visit: Payer: Self-pay

## 2020-03-05 DIAGNOSIS — K146 Glossodynia: Secondary | ICD-10-CM

## 2020-03-05 MED ORDER — CLOTRIMAZOLE 10 MG MT TROC: 10.0000 mg | Freq: Every day | OROMUCOSAL | 0 refills | Status: DC

## 2020-03-05 NOTE — Progress Notes (Signed)
Virtual Visit via Telephone Note  I connected with Mark Cordova on 03/05/20 at  1:00 PM EDT by telephone and verified that I am speaking with the correct person using two identifiers.   I discussed the limitations, risks, security and privacy concerns of performing an evaluation and management service by telephone and the availability of in person appointments. I also discussed with the patient that there may be a patient responsible charge related to this service. The patient expressed understanding and agreed to proceed.  Location patient: home, [place] Location provider: work or home office Participants present for the call: patient, provider, wife Patient did not have a visit in the prior 7 days to address this/these issue(s).   History of Present Illness:   Acute telemedicine visit for tongue issues/soreness: -Onset: 1-2 days -Symptoms include: a little whitish discoloration, fuzziness to the top of the tongue, mild soreness, a few small bumps are a little sore -Denies: fevers, dysphagia, swelling of the tongue or throat, difficulty swallowing, malaise, ulcers or issues elsewhere in the mouthg -Has tried: nothing -Pertinent past medical history: denies DM or recent sugar issues or abx   Observations/Objective: Patient sounds cheerful and well on the phone. I do not appreciate any SOB. Speech and thought processing are grossly intact. Patient reported vitals:  Assessment and Plan:  Soreness of tongue  -we discussed possible serious and likely etiologies, options for evaluation and workup, limitations of telemedicine visit vs in person visit, treatment, treatment risks and precautions. Pt prefers to treat via telemedicine empirically rather than in person at this moment.  Query oral candidiasis versus other.  He opted for trial of brushing the tongue and clotrimazole troches.  Agrees to seek in person evaluation if worsening, new symptoms arise or symptoms do not completely resolve with  treatment.    Did let the patient know that I only do telemedicine shifts for [facility] on Tuesdays and Thursdays and advised a follow up visit with PCP or at an [facility] if has further questions or concerns.   Follow Up Instructions:  I did not refer this patient for an OV in the next 24 hours for this/these issue(s).  I discussed the assessment and treatment plan with the patient. The patient was provided an opportunity to ask questions and all were answered. The patient agreed with the plan and demonstrated an understanding of the instructions.   Spent 15 minutes on this encounter.   [name], DO

## 2020-06-18 ENCOUNTER — Ambulatory Visit: Payer: Non-veteran care | Admitting: Podiatry

## 2020-07-30 ENCOUNTER — Other Ambulatory Visit: Payer: Self-pay

## 2020-07-30 ENCOUNTER — Encounter: Payer: Medicare (Managed Care) | Admitting: Family Medicine

## 2020-07-31 ENCOUNTER — Encounter: Payer: Medicare (Managed Care) | Admitting: Family Medicine

## 2020-08-16 ENCOUNTER — Other Ambulatory Visit: Payer: Self-pay

## 2020-08-19 ENCOUNTER — Encounter: Payer: Medicare (Managed Care) | Admitting: Family Medicine

## 2020-08-28 ENCOUNTER — Ambulatory Visit (INDEPENDENT_AMBULATORY_CARE_PROVIDER_SITE_OTHER): Payer: Medicare (Managed Care) | Admitting: Family Medicine

## 2020-08-28 ENCOUNTER — Other Ambulatory Visit: Payer: Self-pay

## 2020-08-28 ENCOUNTER — Encounter: Payer: Self-pay | Admitting: Family Medicine

## 2020-08-28 VITALS — BP 126/80 | HR 63 | Temp 98.8°F | Ht 72.0 in | Wt 129.6 lb

## 2020-08-28 DIAGNOSIS — K921 Melena: Secondary | ICD-10-CM | POA: Diagnosis not present

## 2020-08-28 DIAGNOSIS — Z0000 Encounter for general adult medical examination without abnormal findings: Secondary | ICD-10-CM | POA: Diagnosis not present

## 2020-08-28 DIAGNOSIS — M2041 Other hammer toe(s) (acquired), right foot: Secondary | ICD-10-CM | POA: Insufficient documentation

## 2020-08-28 DIAGNOSIS — E559 Vitamin D deficiency, unspecified: Secondary | ICD-10-CM

## 2020-08-28 LAB — TSH: TSH: 1.55 u[IU]/mL (ref 0.35–4.50)

## 2020-08-28 LAB — HEMOGLOBIN A1C: Hgb A1c MFr Bld: 5.6 % (ref 4.6–6.5)

## 2020-08-28 LAB — HEPATIC FUNCTION PANEL
ALT: 22 U/L (ref 0–53)
AST: 27 U/L (ref 0–37)
Albumin: 3.8 g/dL (ref 3.5–5.2)
Alkaline Phosphatase: 50 U/L (ref 39–117)
Bilirubin, Direct: 0.1 mg/dL (ref 0.0–0.3)
Total Bilirubin: 0.3 mg/dL (ref 0.2–1.2)
Total Protein: 6.4 g/dL (ref 6.0–8.3)

## 2020-08-28 LAB — BASIC METABOLIC PANEL
BUN: 15 mg/dL (ref 6–23)
CO2: 31 mEq/L (ref 19–32)
Calcium: 9.2 mg/dL (ref 8.4–10.5)
Chloride: 101 mEq/L (ref 96–112)
Creatinine, Ser: 0.87 mg/dL (ref 0.40–1.50)
GFR: 86.51 mL/min (ref >60.00)
Glucose, Bld: 73 mg/dL (ref 70–99)
Potassium: 4 mEq/L (ref 3.5–5.1)
Sodium: 136 mEq/L (ref 135–145)

## 2020-08-28 LAB — VITAMIN D 25 HYDROXY (VIT D DEFICIENCY, FRACTURES): VITD: 32.58 ng/mL (ref 30.00–100.00)

## 2020-08-28 LAB — CBC WITH DIFFERENTIAL/PLATELET
Basophils Absolute: 0 10*3/uL (ref 0.0–0.1)
Basophils Relative: 0.6 % (ref 0.0–3.0)
Eosinophils Absolute: 0.1 10*3/uL (ref 0.0–0.7)
Eosinophils Relative: 2.3 % (ref 0.0–5.0)
HCT: 38.8 % — ABNORMAL LOW (ref 39.0–52.0)
Hemoglobin: 13.2 g/dL (ref 13.0–17.0)
Lymphocytes Relative: 26.8 % (ref 12.0–46.0)
Lymphs Abs: 1.3 10*3/uL (ref 0.7–4.0)
MCHC: 34.1 g/dL (ref 30.0–36.0)
MCV: 102 fl — ABNORMAL HIGH (ref 78.0–100.0)
Monocytes Absolute: 0.6 10*3/uL (ref 0.1–1.0)
Monocytes Relative: 11.6 % (ref 3.0–12.0)
Neutro Abs: 2.9 10*3/uL (ref 1.4–7.7)
Neutrophils Relative %: 58.7 % (ref 43.0–77.0)
Platelets: 251 10*3/uL (ref 150.0–400.0)
RBC: 3.81 Mil/uL — ABNORMAL LOW (ref 4.22–5.81)
RDW: 13.4 % (ref 11.5–15.5)
WBC: 5 10*3/uL (ref 4.0–10.5)

## 2020-08-28 LAB — PSA: PSA: 1.36 ng/mL (ref 0.10–4.00)

## 2020-08-28 LAB — LIPID PANEL
Cholesterol: 159 mg/dL (ref 0–200)
HDL: 78.7 mg/dL (ref >39.00)
LDL Cholesterol: 71 mg/dL (ref 0–99)
NonHDL: 80.67
Total CHOL/HDL Ratio: 2
Triglycerides: 47 mg/dL (ref 0.0–149.0)
VLDL: 9.4 mg/dL (ref 0.0–40.0)

## 2020-08-28 LAB — T3, FREE: T3, Free: 2.8 pg/mL (ref 2.3–4.2)

## 2020-08-28 LAB — T4, FREE: Free T4: 0.83 ng/dL (ref 0.60–1.60)

## 2020-08-28 MED ORDER — OMEPRAZOLE 40 MG PO CPDR: 40.0000 mg | DELAYED_RELEASE_CAPSULE | Freq: Every day | ORAL | 3 refills | Status: DC

## 2020-08-28 MED ORDER — SILDENAFIL CITRATE 100 MG PO TABS: 100.0000 mg | ORAL_TABLET | Freq: Every day | ORAL | 11 refills | Status: DC | PRN

## 2020-08-28 MED ORDER — VITAMIN D 50 MCG (2000 UT) PO CAPS: 1.0000 | ORAL_CAPSULE | Freq: Every day | ORAL | 3 refills | Status: AC

## 2020-08-28 NOTE — Progress Notes (Signed)
Subjective:    Patient ID: Mark Cordova, male    DOB: 19-Jan-1951, 70 y.o.   MRN: [id number]  HPI Here with his wife for a well exam. They have a few concerns. First about every 3 months over the past year he will have 2-3 days of nausea without vomiting, of abdominal cramping, and of seeing dark blood in his stools. During the time between these episodes he feels fine. No heartburn or indigestion. His BMs are typically regular. He has lost about 7 pounds in the past year. He has never smoked. He drinks alcohol 5 days a week, and he averages drinking 4-5 glasses of liquor on these days. He also has a painful toe on the right foot that has been bothering him for 6 months. No hx of trauma. His last colonoscopy was at the [place] at least 10 years ago.    Review of Systems  Constitutional: Positive for unexpected weight change.  HENT: Negative.   Eyes: Negative.   Respiratory: Negative.   Cardiovascular: Negative.   Gastrointestinal: Positive for abdominal pain, blood in stool and nausea. Negative for abdominal distention, anal bleeding, constipation, diarrhea, rectal pain and vomiting.  Genitourinary: Negative.   Musculoskeletal: Negative.   Skin: Negative.   Neurological: Negative.   Psychiatric/Behavioral: Negative.        Objective:   Physical Exam Constitutional:      General: He is not in acute distress.    Appearance: He is well-developed. He is not diaphoretic.     Comments: Thin but not frail   HENT:     Head: Normocephalic and atraumatic.     Right Ear: External ear normal.     Left Ear: External ear normal.     Nose: Nose normal.     Mouth/Throat:     Pharynx: No oropharyngeal exudate.  Eyes:     General: No scleral icterus.       Right eye: No discharge.        Left eye: No discharge.     Conjunctiva/sclera: Conjunctivae normal.     Pupils: Pupils are equal, round, and reactive to light.  Neck:     Thyroid: No thyromegaly.     Vascular: No JVD.     Trachea: No  tracheal deviation.  Cardiovascular:     Rate and Rhythm: Normal rate and regular rhythm.     Heart sounds: Normal heart sounds. No murmur heard. No friction rub. No gallop.   Pulmonary:     Effort: Pulmonary effort is normal. No respiratory distress.     Breath sounds: Normal breath sounds. No wheezing or rales.  Chest:     Chest wall: No tenderness.  Abdominal:     General: Bowel sounds are normal. There is no distension.     Palpations: Abdomen is soft. There is no mass.     Tenderness: There is no abdominal tenderness. There is no guarding or rebound.  Genitourinary:    Penis: Normal. No tenderness.      Testes: Normal.     Prostate: Normal.     Rectum: Normal. Guaiac result negative.  Musculoskeletal:        General: No tenderness. Normal range of motion.     Cervical back: Neck supple.     Comments: The right 3rd toe is a hammertoe with a callus at the tip. No tenderness   Lymphadenopathy:     Cervical: No cervical adenopathy.  Skin:    General: Skin is warm and dry.  Coloration: Skin is not pale.     Findings: No erythema or rash.  Neurological:     General: No focal deficit present.     Mental Status: He is alert and oriented to person, place, and time.     Cranial Nerves: No cranial nerve deficit.     Motor: No abnormal muscle tone.     Coordination: Coordination normal.     Deep Tendon Reflexes: Reflexes are normal and symmetric. Reflexes normal.  Psychiatric:        Behavior: Behavior normal.        Thought Content: Thought content normal.        Judgment: Judgment normal.           Assessment & Plan:  Well exam. We discussed diet and exercise. Get fasting labs. I am concerned he may have peptic ulcer disease so he will start taking Omeprazole 40 mg daily. He is due for a colonoscopy, and we will ask that he have upper endoscopy as well to rule out cancer, etc. We will refer to Podiatry for the hammer toe.  Alysia Penna, MD

## 2020-09-05 ENCOUNTER — Telehealth: Payer: Self-pay | Admitting: Podiatry

## 2020-09-05 NOTE — Telephone Encounter (Signed)
Patient called to schedule appointment for hammer toe based on referral sent by Dr. Sarajane Jews. I informed the patient we do not accept wellcare insurance at [facility], but we will accept his [place] insurance as long as her has a referral from the [place].

## 2020-10-09 ENCOUNTER — Other Ambulatory Visit: Payer: Self-pay

## 2020-10-09 ENCOUNTER — Telehealth: Payer: Self-pay | Admitting: Family Medicine

## 2020-10-09 ENCOUNTER — Ambulatory Visit: Payer: Medicare (Managed Care)

## 2020-10-09 NOTE — Telephone Encounter (Signed)
Tried calling patient to r/s his AWV per Otila Kluver.  Could not leave message.  If patient calls back please r/s 10/09/20 AWV appointment  Per Otila Kluver

## 2020-11-04 ENCOUNTER — Telehealth: Payer: Self-pay | Admitting: Family Medicine

## 2020-11-04 NOTE — Telephone Encounter (Signed)
Left message for patient to call back and schedule Medicare Annual Wellness Visit (AWV) either virtually or in office.   Last AWV 09/19/19  please schedule at anytime with [facility] Nurse Health Advisor 1 or 2   This should be a 45 minute visit.

## 2020-12-04 ENCOUNTER — Telehealth: Payer: Self-pay

## 2020-12-04 NOTE — Progress Notes (Deleted)
Subjective:   Mark Cordova is a 70 y.o. male who presents for an subsequent  Medicare Annual Wellness Visit.   I connected with Mark Cordova today by telephone and verified that I am speaking with the correct person using two identifiers. Location patient: home Location provider: work Persons participating in the virtual visit: patient, provider.   I discussed the limitations, risks, security and privacy concerns of performing an evaluation and management service by telephone and the availability of in person appointments. I also discussed with the patient that there may be a patient responsible charge related to this service. The patient expressed understanding and verbally consented to this telephonic visit.    Interactive audio and video telecommunications were attempted between this provider and patient, however failed, due to patient having technical difficulties OR patient did not have access to video capability.  We continued and completed visit with audio only.    Review of Systems    N/a       Objective:    There were no vitals filed for this visit. There is no height or weight on file to calculate BMI.  Advanced Directives 07/15/2017 07/07/2017 05/24/2014  Does Patient Have a Medical Advance Directive? No No No  Would patient like information on creating a medical advance directive? No - Patient declined No - Patient declined No - patient declined information    Current Medications (verified) Outpatient Encounter Medications as of 12/04/2020  Medication Sig   acyclovir (ZOVIRAX) 800 MG tablet Take 1 tablet (800 mg total) by mouth 5 (five) times daily.   aspirin EC 325 MG tablet Take 1 tablet (325 mg total) by mouth 2 (two) times daily. (Patient taking differently: Take 325 mg by mouth as needed.)   Cholecalciferol (VITAMIN D) 50 MCG (2000 UT) CAPS Take 1 capsule (2,000 Units total) by mouth daily.   clotrimazole (MYCELEX) 10 MG troche Take 1 tablet (10 mg total) by mouth 5  (five) times daily.   omeprazole (PRILOSEC) 40 MG capsule Take 1 capsule (40 mg total) by mouth daily.   sildenafil (VIAGRA) 100 MG tablet Take 1 tablet (100 mg total) by mouth daily as needed for erectile dysfunction.   No facility-administered encounter medications on file as of 12/04/2020.    Allergies (verified) Patient has no known allergies.   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 Social History   Socioeconomic History   Marital status: Single    Spouse name: Not on file   Number of children: Not on file   Years of education: Not on file   Highest education level: Not on file  Occupational History   Not on file  Tobacco Use   Smoking status: Never   Smokeless tobacco: Never  Vaping Use   Vaping Use: Never used  Substance and Sexual Activity   Alcohol use: Yes    Comment: occasionally   Drug use: Yes    Types: Marijuana  Comment: last time 2 weeks    Sexual activity: Yes    Birth control/protection: None  Other Topics Concern   Not on file  Social History Narrative   Not on file   Social Determinants of Health   Financial Resource Strain: Not on file  Food Insecurity: Not on file  Transportation Needs: Not on file  Physical Activity: Not on file  Stress: Not on file  Social Connections: Not on file    Tobacco Counseling Counseling given: Not Answered   Clinical Intake:                 Diabetic?no         Activities of Daily Living No flowsheet data  found.  Patient Care Team: Laurey Morale, MD as PCP - General (Family Medicine)  Indicate any recent Medical Services you may have received from other than [facility] providers in the past year (date may be approximate).     Assessment:   This is a routine wellness examination for [facility]  Hearing/Vision screen No results found.  Dietary issues and exercise activities discussed:     Goals Addressed   None    Depression Screen PHQ 2/9 Scores 09/19/2019  PHQ - 2 Score 0    Fall Risk Fall Risk  09/19/2019  Falls in the past year? 0  Number falls in past yr: 0  Injury with Fall? 0    FALL RISK PREVENTION PERTAINING TO THE HOME:  Any stairs in or around the home? {YES/NO:21197} If so, are there any without handrails? {YES/NO:21197} Home free of loose throw rugs in walkways, pet beds, electrical cords, etc? {YES/NO:21197} Adequate lighting in your home to reduce risk of falls? {YES/NO:21197}  ASSISTIVE DEVICES UTILIZED TO PREVENT FALLS:  Life alert? {YES/NO:21197} Use of a cane, walker or w/c? {YES/NO:21197} Grab bars in the bathroom? {YES/NO:21197} Shower chair or bench in shower? {YES/NO:21197} Elevated toilet seat or a handicapped toilet? {YES/NO:21197}  TIMED UP AND GO:  Was the test performed? {YES/NO:21197}.  Length of time to ambulate 10 feet: *** sec.   {Appearance of OMVE:7209470}  Cognitive Function:        Immunizations Immunization History  Administered Date(s) Administered   Influenza, High Dose Seasonal PF 02/15/2015   Influenza,inj,Quad PF,6+ Mos 04/15/2016   Influenza-Unspecified 01/26/2008, 01/16/2009, 01/25/2012   Janssen (J&J) SARS-COV-2 Vaccination 08/04/2019   Pneumococcal Conjugate-13 05/18/2014, 02/15/2015   Pneumococcal Polysaccharide-23 04/15/2016   Tdap 12/12/2012, 05/18/2014    {TDAP status:2101805}  {Flu Vaccine status:2101806}  {Pneumococcal vaccine status:2101807}  {Covid-19 vaccine status:2101808}  Qualifies for Shingles  Vaccine? {YES/NO:21197}  Zostavax completed {YES/NO:21197}  {Shingrix Completed?:2101804}  Screening Tests Health Maintenance  Topic Date Due   Hepatitis C Screening  Never done   Zoster Vaccines- Shingrix (1 of 2) Never done   COVID-19 Vaccine (2 - Booster for Janssen series) 09/29/2019   INFLUENZA VACCINE  12/16/2020   COLONOSCOPY (Pts 45-69yrs Insurance coverage will need to be confirmed)  05/18/2024   TETANUS/TDAP  05/18/2024   PNA vac Low Risk Adult  Completed   HPV VACCINES  Aged Out    Health Maintenance  Health Maintenance Due  Topic Date Due   Hepatitis C Screening  Never done   Zoster Vaccines- Shingrix (1 of 2) Never done   COVID-19 Vaccine (2 - Booster for Janssen series) 09/29/2019    {Colorectal cancer screening:2101809}  Lung Cancer Screening: (Low Dose CT Chest recommended if Age 68-80 years, 30 pack-year currently smoking OR have quit w/in 15years.) {  DOES NOT does:27190::"does not"} qualify.   Lung Cancer Screening Referral: ***  Additional Screening:  Hepatitis C Screening: {DOES NOT does:27190::"does not"} qualify; Completed ***  Vision Screening: Recommended annual ophthalmology exams for early detection of glaucoma and other disorders of the eye. Is the patient up to date with their annual eye exam?  {YES/NO:21197} Who is the provider or what is the name of the office in which the patient attends annual eye exams? *** If pt is not established with a provider, would they like to be referred to a provider to establish care? {YES/NO:21197}.   Dental Screening: Recommended annual dental exams for proper oral hygiene  Community Resource Referral / Chronic Care Management: CRR required this visit?  {YES/NO:21197}  CCM required this visit?  {YES/NO:21197}     Plan:     I have personally reviewed and noted the following in the patient's chart:   Medical and social history Use of alcohol, tobacco or illicit drugs  Current medications and  supplements including opioid prescriptions. {Opioid Prescriptions:806 [id number]} Functional ability and status Nutritional status Physical activity Advanced directives List of other physicians Hospitalizations, surgeries, and ER visits in previous 12 months Vitals Screenings to include cognitive, depression, and falls Referrals and appointments  In addition, I have reviewed and discussed with patient certain preventive protocols, quality metrics, and best practice recommendations. A written personalized care plan for preventive services as well as general preventive health recommendations were provided to patient.     Randel Pigg, LPN   09/01/5299   Nurse Notes: ***

## 2020-12-04 NOTE — Telephone Encounter (Signed)
Called patient spoke with , he states he is unable to completed his visit today he something to go do. Declined offer to reschedule states he will call us to schedule.  L.Elicia Lui,LPN

## 2020-12-10 NOTE — Telephone Encounter (Signed)
Documented on spreadsheet 

## 2020-12-13 ENCOUNTER — Ambulatory Visit (INDEPENDENT_AMBULATORY_CARE_PROVIDER_SITE_OTHER): Payer: Medicare (Managed Care)

## 2020-12-13 DIAGNOSIS — Z0000 Encounter for general adult medical examination without abnormal findings: Secondary | ICD-10-CM

## 2020-12-13 NOTE — Patient Instructions (Signed)
Mark Cordova , Thank you for taking time to come for your Medicare Wellness Visit. I appreciate your ongoing commitment to your health goals. Please review the following plan we discussed and let me know if I can assist you in the future.   Screening recommendations/referrals: Colonoscopy: 05/18/2014  due 2026 Recommended yearly ophthalmology/optometry visit for glaucoma screening and checkup Recommended yearly dental visit for hygiene and checkup  Vaccinations: Influenza vaccine: due in fall 2022  Pneumococcal vaccine: completed series  Tdap vaccine: 05/18/2014 Shingles vaccine: will obtain [facility]     Advanced directives: will provide copies   Conditions/risks identified: none   Next appointment: none   Preventive Care 18 Years and Older, Male Preventive care refers to lifestyle choices and visits with your health care provider that can promote health and wellness. What does preventive care include? A yearly physical exam. This is also called an annual well check. Dental exams once or twice a year. Routine eye exams. Ask your health care provider how often you should have your eyes checked. Personal lifestyle choices, including: Daily care of your teeth and gums. Regular physical activity. Eating a healthy diet. Avoiding tobacco and drug use. Limiting alcohol use. Practicing safe sex. Taking low doses of aspirin every day. Taking vitamin and mineral supplements as recommended by your health care provider. What happens during an annual well check? The services and screenings done by your health care provider during your annual well check will depend on your age, overall health, lifestyle risk factors, and family history of disease. Counseling  Your health care provider may ask you questions about your: Alcohol use. Tobacco use. Drug use. Emotional well-being. Home and relationship well-being. Sexual activity. Eating habits. History of falls. Memory and ability to understand  (cognition). Work and work Statistician. Screening  You may have the following tests or measurements: Height, weight, and BMI. Blood pressure. Lipid and cholesterol levels. These may be checked every 5 years, or more frequently if you are over 51 years old. Skin check. Lung cancer screening. You may have this screening every year starting at age 45 if you have a 30-pack-year history of smoking and currently smoke or have quit within the past 15 years. Fecal occult blood test (FOBT) of the stool. You may have this test every year starting at age 65. Flexible sigmoidoscopy or colonoscopy. You may have a sigmoidoscopy every 5 years or a colonoscopy every 10 years starting at age 81. Prostate cancer screening. Recommendations will vary depending on your family history and other risks. Hepatitis C blood test. Hepatitis B blood test. Sexually transmitted disease (STD) testing. Diabetes screening. This is done by checking your blood sugar (glucose) after you have not eaten for a while (fasting). You may have this done every 1-3 years. Abdominal aortic aneurysm (AAA) screening. You may need this if you are a current or former smoker. Osteoporosis. You may be screened starting at age 30 if you are at high risk. Talk with your health care provider about your test results, treatment options, and if necessary, the need for more tests. Vaccines  Your health care provider may recommend certain vaccines, such as: Influenza vaccine. This is recommended every year. Tetanus, diphtheria, and acellular pertussis (Tdap, Td) vaccine. You may need a Td booster every 10 years. Zoster vaccine. You may need this after age 79. Pneumococcal 13-valent conjugate (PCV13) vaccine. One dose is recommended after age 54. Pneumococcal polysaccharide (PPSV23) vaccine. One dose is recommended after age 79. Talk to your health care provider about  which screenings and vaccines you need and how often you need them. This  information is not intended to replace advice given to you by your health care provider. Make sure you discuss any questions you have with your health care provider. Document Released: 05/31/2015 Document Revised: 01/22/2016 Document Reviewed: 03/05/2015 Elsevier Interactive Patient Education  2017 [employer] Prevention in the Home Falls can cause injuries. They can happen to people of all ages. There are many things you can do to make your home safe and to help prevent falls. What can I do on the outside of my home? Regularly fix the edges of walkways and driveways and fix any cracks. Remove anything that might make you trip as you walk through a door, such as a raised step or threshold. Trim any bushes or trees on the path to your home. Use bright outdoor lighting. Clear any walking paths of anything that might make someone trip, such as rocks or tools. Regularly check to see if handrails are loose or broken. Make sure that both sides of any steps have handrails. Any raised decks and porches should have guardrails on the edges. Have any leaves, snow, or ice cleared regularly. Use sand or salt on walking paths during winter. Clean up any spills in your garage right away. This includes oil or grease spills. What can I do in the bathroom? Use night lights. Install grab bars by the toilet and in the tub and shower. Do not use towel bars as grab bars. Use non-skid mats or decals in the tub or shower. If you need to sit down in the shower, use a plastic, non-slip stool. Keep the floor dry. Clean up any water that spills on the floor as soon as it happens. Remove soap buildup in the tub or shower regularly. Attach bath mats securely with double-sided non-slip rug tape. Do not have throw rugs and other things on the floor that can make you trip. What can I do in the bedroom? Use night lights. Make sure that you have a light by your bed that is easy to reach. Do not use any sheets or  blankets that are too big for your bed. They should not hang down onto the floor. Have a firm chair that has side arms. You can use this for support while you get dressed. Do not have throw rugs and other things on the floor that can make you trip. What can I do in the kitchen? Clean up any spills right away. Avoid walking on wet floors. Keep items that you use a lot in easy-to-reach places. If you need to reach something above you, use a strong step stool that has a grab bar. Keep electrical cords out of the way. Do not use floor polish or wax that makes floors slippery. If you must use wax, use non-skid floor wax. Do not have throw rugs and other things on the floor that can make you trip. What can I do with my stairs? Do not leave any items on the stairs. Make sure that there are handrails on both sides of the stairs and use them. Fix handrails that are broken or loose. Make sure that handrails are as long as the stairways. Check any carpeting to make sure that it is firmly attached to the stairs. Fix any carpet that is loose or worn. Avoid having throw rugs at the top or bottom of the stairs. If you do have throw rugs, attach them to the floor with  carpet tape. Make sure that you have a light switch at the top of the stairs and the bottom of the stairs. If you do not have them, ask someone to add them for you. What else can I do to help prevent falls? Wear shoes that: Do not have high heels. Have rubber bottoms. Are comfortable and fit you well. Are closed at the toe. Do not wear sandals. If you use a stepladder: Make sure that it is fully opened. Do not climb a closed stepladder. Make sure that both sides of the stepladder are locked into place. Ask someone to hold it for you, if possible. Clearly mark and make sure that you can see: Any grab bars or handrails. First and last steps. Where the edge of each step is. Use tools that help you move around (mobility aids) if they are  needed. These include: Canes. Walkers. Scooters. Crutches. Turn on the lights when you go into a dark area. Replace any light bulbs as soon as they burn out. Set up your furniture so you have a clear path. Avoid moving your furniture around. If any of your floors are uneven, fix them. If there are any pets around you, be aware of where they are. Review your medicines with your doctor. Some medicines can make you feel dizzy. This can increase your chance of falling. Ask your doctor what other things that you can do to help prevent falls. This information is not intended to replace advice given to you by your health care provider. Make sure you discuss any questions you have with your health care provider. Document Released: 02/28/2009 Document Revised: 10/10/2015 Document Reviewed: 06/08/2014 Elsevier Interactive Patient Education  2017 [employer].

## 2020-12-13 NOTE — Progress Notes (Signed)
Subjective:   Mark Cordova is a 70 y.o. male who presents for an Initial Medicare Annual Wellness Visit.   I connected with Mark Cordova today by telephone and verified that I am speaking with the correct person using two identifiers. Location patient: home Location provider: work Persons participating in the virtual visit: patient, provider.   I discussed the limitations, risks, security and privacy concerns of performing an evaluation and management service by telephone and the availability of in person appointments. I also discussed with the patient that there may be a patient responsible charge related to this service. The patient expressed understanding and verbally consented to this telephonic visit.    Interactive audio and video telecommunications were attempted between this provider and patient, however failed, due to patient having technical difficulties OR patient did not have access to video capability.  We continued and completed visit with audio only.    Review of Systems    N/a       Objective:    There were no vitals filed for this visit. There is no height or weight on file to calculate BMI.  Advanced Directives 07/15/2017 07/07/2017 05/24/2014  Does Patient Have a Medical Advance Directive? No No No  Would patient like information on creating a medical advance directive? No - Patient declined No - Patient declined No - patient declined information    Current Medications (verified) Outpatient Encounter Medications as of 12/13/2020  Medication Sig   acyclovir (ZOVIRAX) 800 MG tablet Take 1 tablet (800 mg total) by mouth 5 (five) times daily.   aspirin EC 325 MG tablet Take 1 tablet (325 mg total) by mouth 2 (two) times daily. (Patient taking differently: Take 325 mg by mouth as needed.)   Cholecalciferol (VITAMIN D) 50 MCG (2000 UT) CAPS Take 1 capsule (2,000 Units total) by mouth daily.   clotrimazole (MYCELEX) 10 MG troche Take 1 tablet (10 mg total) by mouth 5  (five) times daily.   omeprazole (PRILOSEC) 40 MG capsule Take 1 capsule (40 mg total) by mouth daily.   sildenafil (VIAGRA) 100 MG tablet Take 1 tablet (100 mg total) by mouth daily as needed for erectile dysfunction.   No facility-administered encounter medications on file as of 12/13/2020.    Allergies (verified) Patient has no known allergies.   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 Social History   Socioeconomic History   Marital status: Single    Spouse name: Not on file   Number of children: Not on file   Years of education: Not on file   Highest education level: Not on file  Occupational History   Not on file  Tobacco Use   Smoking status: Never   Smokeless tobacco: Never  Vaping Use   Vaping Use: Never used  Substance and Sexual Activity   Alcohol use: Yes    Comment: occasionally   Drug use: Yes    Types: Marijuana  Comment: last time 2 weeks    Sexual activity: Yes    Birth control/protection: None  Other Topics Concern   Not on file  Social History Narrative   Not on file   Social Determinants of Health   Financial Resource Strain: Not on file  Food Insecurity: Not on file  Transportation Needs: Not on file  Physical Activity: Not on file  Stress: Not on file  Social Connections: Not on file    Tobacco Counseling Counseling given: Not Answered   Clinical Intake:                 Diabetic?no         Activities of Daily Living No flowsheet data  found.  Patient Care Team: Laurey Morale, MD as PCP - General (Family Medicine)  Indicate any recent Medical Services you may have received from other than [facility] providers in the past year (date may be approximate).     Assessment:   This is a routine wellness examination for [facility].  Hearing/Vision screen No results found.  Dietary issues and exercise activities discussed:     Goals Addressed   None    Depression Screen PHQ 2/9 Scores 09/19/2019  PHQ - 2 Score 0    Fall Risk Fall Risk  09/19/2019  Falls in the past year? 0  Number falls in past yr: 0  Injury with Fall? 0    FALL RISK PREVENTION PERTAINING TO THE HOME:  Any stairs in or around the home? No  If so, are there any without handrails? No  Home free of loose throw rugs in walkways, pet beds, electrical cords, etc? Yes  Adequate lighting in your home to reduce risk of falls? Yes   ASSISTIVE DEVICES UTILIZED TO PREVENT FALLS:  Life alert? No  Use of a cane, walker or w/c? No  Grab bars in the bathroom? No  Shower chair or bench in shower? No  Elevated toilet seat or a handicapped toilet? No    Cognitive Function:  Normal cognitive status assessed by direct observation by this Nurse Health Advisor. No abnormalities found.        Immunizations Immunization History  Administered Date(s) Administered   Influenza, High Dose Seasonal PF 02/15/2015   Influenza,inj,Quad PF,6+ Mos 04/15/2016   Influenza-Unspecified 01/26/2008, 01/16/2009, 01/25/2012   Janssen (J&J) SARS-COV-2 Vaccination 08/04/2019   Pneumococcal Conjugate-13 05/18/2014, 02/15/2015   Pneumococcal Polysaccharide-23 04/15/2016   Tdap 12/12/2012, 05/18/2014    TDAP status: Up to date  Flu Vaccine status: Up to date  Pneumococcal vaccine status: Up to date  Covid-19 vaccine status: Completed vaccines  Qualifies for Shingles Vaccine? Yes   Zostavax completed No   Shingrix Completed?: No.    Education has been provided regarding the  importance of this vaccine. Patient has been advised to call insurance company to determine out of pocket expense if they have not yet received this vaccine. Advised may also receive vaccine at local pharmacy or Health Dept. Verbalized acceptance and understanding.  Screening Tests Health Maintenance  Topic Date Due   Hepatitis C Screening  Never done   Zoster Vaccines- Shingrix (1 of 2) Never done   COVID-19 Vaccine (2 - Booster for Janssen series) 09/29/2019   INFLUENZA VACCINE  12/16/2020   COLONOSCOPY (Pts 45-53yr Insurance coverage will need to be confirmed)  05/18/2024   TETANUS/TDAP  05/18/2024   PNA vac Low Risk Adult  Completed   HPV VACCINES  Aged Out    Health Maintenance  Health Maintenance Due  Topic Date Due   Hepatitis C Screening  Never done   Zoster Vaccines- Shingrix (1 of 2) Never done   COVID-19 Vaccine (2 - Booster for Janssen series) 09/29/2019    Colorectal cancer screening: Type of screening: Colonoscopy. Completed 05/18/2014. Repeat every 10 years  Lung Cancer Screening: (Low Dose CT Chest recommended if Age 37-80 years, 30 pack-year currently smoking OR have quit w/in 15years.) does not qualify.   Lung Cancer Screening Referral: n/a  Additional Screening:  Hepatitis C Screening: does not qualify;   Vision Screening: Recommended annual ophthalmology exams for early detection of glaucoma and other disorders of the eye. Is the patient up to date with their annual eye exam?  Yes  Who is the provider or what is the name of the office in which the patient attends annual eye exams? Scheduled Dr.Fox  If pt is not established with a provider, would they like to be referred to a provider to establish care? No .   Dental Screening: Recommended annual dental exams for proper oral hygiene  Community Resource Referral / Chronic Care Management: CRR required this visit?  No   CCM required this visit?  No      Plan:     I have personally reviewed and  noted the following in the patient's chart:   Medical and social history Use of alcohol, tobacco or illicit drugs  Current medications and supplements including opioid prescriptions. Patient is not currently taking opioid prescriptions. Functional ability and status Nutritional status Physical activity Advanced directives List of other physicians Hospitalizations, surgeries, and ER visits in previous 12 months Vitals Screenings to include cognitive, depression, and falls Referrals and appointments  In addition, I have reviewed and discussed with patient certain preventive protocols, quality metrics, and best practice recommendations. A written personalized care plan for preventive services as well as general preventive health recommendations were provided to patient.     Randel Pigg, LPN   [id number]   Nurse Notes: none

## 2021-04-03 ENCOUNTER — Telehealth: Payer: Self-pay | Admitting: Family Medicine

## 2021-04-03 DIAGNOSIS — M2041 Other hammer toe(s) (acquired), right foot: Secondary | ICD-10-CM

## 2021-04-03 NOTE — Telephone Encounter (Signed)
Patient's wife Katharine Look called asking about a referral being placed for the patient for Dr. March Rummage, who is a podiatrist.   She says that a referral was placed for Dr. March Rummage in the past but it was expired.  Patient can be reached at [phone] and Katharine Look can be reached at [phone].  Please advise.

## 2021-04-03 NOTE — Telephone Encounter (Signed)
Dr. Hardie Pulley at [facility].   Is okay to place referral?

## 2021-04-14 ENCOUNTER — Telehealth: Payer: Self-pay

## 2021-04-14 NOTE — Telephone Encounter (Signed)
Spoke with pt wife who is listed on pt DPR advised that it is important to take pt to the ED due to pt vomiting blood for the past 4 days as reported, Pt wife state that pt was refusing to go but advised that the ED will be able to run tests and imaging to see the root cause and of the bleeding and treatment. Pt wife agreed and verbalized understanding

## 2021-04-14 NOTE — Telephone Encounter (Signed)
Caller states her husband is vomiting blood. Symptoms started before last. Vomited 3 times, all with blood in the vomit. No fever.  04/11/2021 8:52:21 AM Go to ED Now Yes Gloriann Loan, RN, [place] Understands Yes  Referrals [facility] - [facility]  04/14/21 1024: Pt states he is feeling "100%" now. Did not note blood in stool during this time. Pt states he is not sure why he was vomiting, but "has always had a problem with my stomach". States he did not go ED b/c he did not have a fever or other issues. Pt advised that if episode occurs again to call office to schedule same day appt or go to UC after hrs. Pt verb understanding.

## 2021-04-15 NOTE — Telephone Encounter (Signed)
I did another referral

## 2021-04-15 NOTE — Telephone Encounter (Signed)
Spoke with Mark Cordova advisd that Dr Sarajane Jews placed another  referral to Podiatry, advised Mark Cordova to call their office in 2 days to check on the scheduling

## 2021-04-28 ENCOUNTER — Ambulatory Visit: Payer: Medicare (Managed Care) | Admitting: Podiatry

## 2021-06-19 ENCOUNTER — Telehealth (INDEPENDENT_AMBULATORY_CARE_PROVIDER_SITE_OTHER): Payer: Medicare (Managed Care) | Admitting: Family Medicine

## 2021-06-19 ENCOUNTER — Encounter: Payer: Self-pay | Admitting: Family Medicine

## 2021-06-19 DIAGNOSIS — H5789 Other specified disorders of eye and adnexa: Secondary | ICD-10-CM | POA: Diagnosis not present

## 2021-06-19 NOTE — Progress Notes (Signed)
Virtual Visit via Video Note  I connected with Mark Cordova  on 06/19/21 at 11:20 AM EST by a video enabled telemedicine application and verified that I am speaking with the correct person using two identifiers.  Location patient: [name] Location provider:work or home office Persons participating in the virtual visit: patient, provider  I discussed the limitations and requested verbal permission for telemedicine visit. The patient expressed understanding and agreed to proceed.   HPI:  Acute telemedicine visit for eye irritation: -Onset: 3-4 days ago -Symptoms include:eye feels irritated/gritty, feels uncomfortable but not super painful, reports eye is very red -Denies: trauma or foreign body in the eye to his knowledge, vision changes, drainage from the eye -Pertinent past medical history: see below -Pertinent medication allergies:No Known Allergies -COVID-19 vaccine status:  Immunization History  Administered Date(s) Administered   Influenza, High Dose Seasonal PF 02/15/2015   Influenza,inj,Quad PF,6+ Mos 04/15/2016   Influenza-Unspecified 01/26/2008, 01/16/2009, 01/25/2012   Janssen (J&J) SARS-COV-2 Vaccination 08/04/2019   Pneumococcal Conjugate-13 05/18/2014, 02/15/2015   Pneumococcal Polysaccharide-23 04/15/2016   Tdap 12/12/2012, 05/18/2014     ROS: See pertinent positives and negatives per HPI.  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   Procedure: RIGHT TOTAL KNEE ARTHROPLASTY;  Surgeon: Leandrew Koyanagi, MD;  Location: [place];  Service: Orthopedics;  Laterality: Right;     Current Outpatient Medications:    acyclovir (ZOVIRAX) 800 MG tablet, Take 1 tablet (800 mg  total) by mouth 5 (five) times daily., Disp: 100 tablet, Rfl: 5   aspirin EC 325 MG tablet, Take 1 tablet (325 mg total) by mouth 2 (two) times daily., Disp: 84 tablet, Rfl: 0   Cholecalciferol (VITAMIN D) 50 MCG (2000 UT) CAPS, Take 1 capsule (2,000 Units total) by mouth daily., Disp: 90 capsule, Rfl: 3   sildenafil (VIAGRA) 100 MG tablet, Take 1 tablet (100 mg total) by mouth daily as needed for erectile dysfunction., Disp: 10 tablet, Rfl: 11  EXAM:  VITALS per patient if applicable:  GENERAL: alert, oriented, appears well and in no acute distress  HEENT: atraumatic, limited lighting and video quality not great - R conjunctiva appears veryread throughout  NECK: normal movements of the head and neck  LUNGS: on inspection no signs of respiratory distress, breathing rate appears normal, no obvious gross SOB, gasping or wheezing  CV: no obvious cyanosis  MS: moves all visible extremities without noticeable abnormality  PSYCH/NEURO: pleasant and cooperative, no obvious depression or anxiety, speech and thought processing grossly intact  ASSESSMENT AND PLAN:  Discussed the following assessment and plan:  Eye irritation  Red eye  -we discussed possible serious and likely etiologies, options for evaluation and workup, limitations of telemedicine visit vs in person visit, treatment, treatment risks and precautions. Pt is agreeable to treatment via telemedicine at this moment. Query subconjunctival hemorrhage vs other, limited exam possible over video visit. Doubt infection given no drainage. After discussion feel inperson evaluation at higher level of care with proper tools to exam the eye to confirm dx is warranted and discussed options. He and wife prefer to go to [facility] today.    I  discussed the assessment and treatment plan with the patient. The patient was provided an opportunity to ask questions and all were answered. The patient agreed with the plan and demonstrated an understanding of  the instructions.     [name], DO

## 2021-06-19 NOTE — Patient Instructions (Signed)
Seek inperson care today as we discussed for a good eye exam.    I hope you are feeling better soon!   It was nice to meet you today. I help  out with telemedicine visits on Tuesdays and Thursdays and am happy to help if you need a virtual follow up visit on those days. Otherwise, if you have any concerns or questions following this visit please schedule a follow up visit with your Primary Care office or seek care at a local urgent care clinic to avoid delays in care

## 2021-08-05 ENCOUNTER — Encounter: Payer: Self-pay | Admitting: Family Medicine

## 2021-08-05 ENCOUNTER — Ambulatory Visit (INDEPENDENT_AMBULATORY_CARE_PROVIDER_SITE_OTHER): Payer: Medicare (Managed Care) | Admitting: Family Medicine

## 2021-08-05 ENCOUNTER — Ambulatory Visit: Payer: Medicare (Managed Care) | Admitting: Family Medicine

## 2021-08-05 VITALS — BP 130/86 | HR 61 | Temp 98.1°F | Wt 129.2 lb

## 2021-08-05 DIAGNOSIS — H5789 Other specified disorders of eye and adnexa: Secondary | ICD-10-CM | POA: Diagnosis not present

## 2021-08-05 NOTE — Progress Notes (Signed)
? ?  Subjective:  ? ? Patient ID: Mark Cordova, male    DOB: 1950/07/09, 71 y.o.   MRN: [id number] ? ?HPI ?Here for intermittent redness and mild pain in the right eye. This started 3 weeks ago. No recent trauma. No itching or mucus DC. No other allergy symptoms. He says his vision is still normal. Bright lights are not painful. He does not wear contact lenses. He has not had an eye exam in about 8 years.  ? ? ?Review of Systems  ?Constitutional: Negative.   ?HENT: Negative.    ?Eyes:  Positive for pain and redness. Negative for photophobia, discharge, itching and visual disturbance.  ?Respiratory: Negative.    ? ?   ?Objective:  ? Physical Exam ?Constitutional:   ?   General: He is not in acute distress. ?   Appearance: Normal appearance.  ?HENT:  ?   Right Ear: Tympanic membrane, ear canal and external ear normal.  ?   Left Ear: Tympanic membrane, ear canal and external ear normal.  ?   Nose: Nose normal.  ?   Mouth/Throat:  ?   Pharynx: Oropharynx is clear.  ?Eyes:  ?   General:     ?   Right eye: No discharge.     ?   Left eye: No discharge.  ?   Extraocular Movements: Extraocular movements intact.  ?   Pupils: Pupils are equal, round, and reactive to light.  ?   Comments: No photophobia. The right conjunctiva is a faint red. The left eye is clear   ?Neurological:  ?   Mental Status: He is alert.  ? ? ? ? ? ?   ?Assessment & Plan:  ?Right eye redness and pain. I am concerned about possible glaucoma. We will refer him urgently to Ophthalmology to evaluate.  ?Alysia Penna, MD  ? ?

## 2021-08-11 ENCOUNTER — Telehealth: Payer: Self-pay

## 2021-08-11 NOTE — Telephone Encounter (Signed)
Spoke with pt advised that Dr Sarajane Jews recommends to go  to the ED for an eye exam, verbalized understanding ?

## 2021-08-11 NOTE — Telephone Encounter (Signed)
Pt called into the office and wanted to inform Dr. Sarajane Jews that his referral was sent to [facility] opthalmology but when he called to make an appointment he was told he could not get into the office until June. He stated his eyes are taking a turn for the worse they are getting redder he would like to know are there any other options? ?

## 2021-10-07 ENCOUNTER — Encounter: Payer: Self-pay | Admitting: Family Medicine

## 2021-10-07 ENCOUNTER — Ambulatory Visit (INDEPENDENT_AMBULATORY_CARE_PROVIDER_SITE_OTHER): Payer: Medicare PPO | Admitting: Family Medicine

## 2021-10-07 VITALS — BP 110/80 | HR 55 | Temp 98.0°F | Wt 125.1 lb

## 2021-10-07 DIAGNOSIS — H547 Unspecified visual loss: Secondary | ICD-10-CM

## 2021-10-07 DIAGNOSIS — J40 Bronchitis, not specified as acute or chronic: Secondary | ICD-10-CM | POA: Diagnosis not present

## 2021-10-07 MED ORDER — SILDENAFIL CITRATE 100 MG PO TABS: 100.0000 mg | ORAL_TABLET | Freq: Every day | ORAL | 11 refills | Status: DC | PRN

## 2021-10-07 MED ORDER — AZITHROMYCIN 250 MG PO TABS: ORAL_TABLET | ORAL | 0 refills | Status: DC

## 2021-10-07 MED ORDER — ACYCLOVIR 800 MG PO TABS: 800.0000 mg | ORAL_TABLET | Freq: Every day | ORAL | 5 refills | Status: DC

## 2021-10-07 NOTE — Progress Notes (Signed)
   Subjective:    Patient ID: Mark Cordova, male    DOB: May 25, 1950, 71 y.o.   MRN: [id number]  HPI Here for 10 days of PND, chest congestion and coughing up clear sputum. No fever or SOB. He has taken nothing for this. Also we saw him several months ago for redness in the eyes. We referred him to Ophthalmology, but the earliest appt they could give him was for more than 2 months in the future. He was upset so he told them to forget about it. The redness has since gone away and his eyes feel fine.    Review of Systems  Constitutional: Negative.   HENT:  Positive for postnasal drip. Negative for congestion, sinus pressure and sore throat.   Eyes: Negative.   Respiratory:  Positive for cough. Negative for shortness of breath and wheezing.   Cardiovascular: Negative.       Objective:   Physical Exam Constitutional:      Appearance: Normal appearance. He is not ill-appearing.  HENT:     Right Ear: Tympanic membrane, ear canal and external ear normal.     Left Ear: Tympanic membrane, ear canal and external ear normal.     Nose: Nose normal.     Mouth/Throat:     Pharynx: Oropharynx is clear.  Eyes:     Conjunctiva/sclera: Conjunctivae normal.     Pupils: Pupils are equal, round, and reactive to light.  Pulmonary:     Effort: Pulmonary effort is normal. No respiratory distress.     Breath sounds: Normal breath sounds. No stridor. No wheezing, rhonchi or rales.  Lymphadenopathy:     Cervical: No cervical adenopathy.  Neurological:     Mental Status: He is alert.          Assessment & Plan:  Bronchitis, treat with a Zpack. Add Delsym as needed. We still think he needs an eye exam, so we will refer him to a different group.  Alysia Penna, MD

## 2021-10-07 NOTE — Addendum Note (Signed)
Addended by: Alysia Penna A on: 10/07/2021 05:21 PM   Modules accepted: Orders

## 2021-10-07 NOTE — Addendum Note (Signed)
Addended by: Alysia Penna A on: 10/07/2021 05:20 PM   Modules accepted: Orders

## 2021-12-10 ENCOUNTER — Telehealth: Payer: Self-pay | Admitting: Family Medicine

## 2021-12-10 NOTE — Telephone Encounter (Signed)
Left message for patient to call back and schedule Medicare Annual Wellness Visit (AWV) either virtually or in office. Left  my Herbie Drape number [phone]   Last AWV 12/13/20 ; please schedule at anytime with [facility] Nurse Health Advisor 1 or 2

## 2021-12-22 ENCOUNTER — Telehealth: Payer: Self-pay

## 2021-12-22 NOTE — Telephone Encounter (Signed)
This nurse called patient for scheduled telephonic AWV. Patient states that he forgot and that we needed to reschedule. Rescheduled for 12/30/2021.

## 2021-12-30 ENCOUNTER — Telehealth: Payer: Self-pay

## 2021-12-30 NOTE — Telephone Encounter (Signed)
This nurse attempted to call patient three times for telephonic AWV. Mailbox was full and unable to leave a message.

## 2022-01-12 ENCOUNTER — Ambulatory Visit (INDEPENDENT_AMBULATORY_CARE_PROVIDER_SITE_OTHER): Payer: Medicare PPO

## 2022-01-12 VITALS — Ht 72.0 in | Wt 145.0 lb

## 2022-01-12 DIAGNOSIS — Z0000 Encounter for general adult medical examination without abnormal findings: Secondary | ICD-10-CM | POA: Diagnosis not present

## 2022-01-12 NOTE — Progress Notes (Signed)
I connected with Mark Cordova today by telephone and verified that I am speaking with the correct person using two identifiers. Location patient: home Location provider: work Persons participating in the virtual visit: Mark Cordova, Glenna Durand LPN.   I discussed the limitations, risks, security and privacy concerns of performing an evaluation and management service by telephone and the availability of in person appointments. I also discussed with the patient that there may be a patient responsible charge related to this service. The patient expressed understanding and verbally consented to this telephonic visit.    Interactive audio and video telecommunications were attempted between this provider and patient, however failed, due to patient having technical difficulties OR patient did not have access to video capability.  We continued and completed visit with audio only.     Vital signs may be patient reported or missing.  Subjective:   Mark Cordova is a 71 y.o. male who presents for Medicare Annual/Subsequent preventive examination.  Review of Systems     Cardiac Risk Factors include: advanced age (>92mn, >>60women);male gender     Objective:    Today's Vitals   01/12/22 1611  Weight: 145 lb (65.8 kg)  Height: 6' (1.829 m)   Body mass index is 19.67 kg/m.     01/12/2022    4:15 PM 12/13/2020    9:50 AM 07/15/2017    1:39 PM 07/07/2017    3:13 PM 05/24/2014    5:39 PM  Advanced Directives  Does Patient Have a Medical Advance Directive? No Yes No No No  Type of ACorporate treasure[place]f [place] will     Copy of [place]in Chart?  No - copy requested     Would patient like information on creating a medical advance directive?   No - Patient declined No - Patient declined No - patient declined information    Current Medications (verified) Outpatient Encounter Medications as of 01/12/2022  Medication Sig   acyclovir (ZOVIRAX) 800 MG  tablet Take 1 tablet (800 mg total) by mouth 5 (five) times daily.   Cholecalciferol (VITAMIN D) 50 MCG (2000 UT) CAPS Take 1 capsule (2,000 Units total) by mouth daily.   azithromycin (ZITHROMAX Z-PAK) 250 MG tablet As directed (Patient not taking: Reported on 01/12/2022)   sildenafil (VIAGRA) 100 MG tablet Take 1 tablet (100 mg total) by mouth daily as needed for erectile dysfunction.   No facility-administered encounter medications on file as of 01/12/2022.    Allergies (verified) Patient has no known allergies.   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 No pertinent family history. Social History   Socioeconomic History   Marital status: Single    Spouse name: Not on file   Number of children: Not on file   Years of education: Not on file   Highest education level: Not on file  Occupational History   Not on file  Tobacco Use   Smoking status: Never  Smokeless tobacco: Never  Vaping Use   Vaping Use: Never used  Substance and Sexual Activity   Alcohol use: Yes    Comment: occasionally   Drug use: Yes    Types: Marijuana    Comment: last time 2 weeks    Sexual activity: Yes    Birth control/protection: None  Other Topics Concern   Not on file  Social History Narrative   Not on file   Social Determinants of Health   Financial Resource Strain: Low Risk  (01/12/2022)   Overall Financial Resource Strain (CARDIA)    Difficulty of Paying Living Expenses: Not hard at all  Food Insecurity: No Food  Insecurity (01/12/2022)   Hunger Vital Sign    Worried About Running Out of Food in the Last Year: Never true    Ran Out of Food in the Last Year: Never true  Transportation Needs: No Transportation Needs (01/12/2022)   PRAPARE - Hydrologist (Medical): No    Lack of Transportation (Non-Medical): No  Physical Activity: Sufficiently Active (01/12/2022)   Exercise Vital Sign    Days of Exercise per Week: 4 days    Minutes of Exercise per Session: 40 min  Stress: No Stress Concern Present (01/12/2022)   [place]    Feeling of Stress : Not at all  Social Connections: Moderately Integrated (12/13/2020)   Social Connection and Isolation Panel [NHANES]    Frequency of Communication with Friends and Family: Twice a week    Frequency of Social Gatherings with Friends and Family: Twice a week    Attends Religious Services: 1 to 4 times per year    Active Member of [employer] or Organizations: No    Attends Music therapist: Never    Marital Status: Living with partner    Tobacco Counseling Counseling given: Not Answered   Clinical Intake:  Pre-visit preparation completed: Yes  Pain : No/denies pain     Nutritional Status: BMI of 19-24  Normal Nutritional Risks: None Diabetes: No  How often do you need to have someone help you when you read instructions, pamphlets, or other written materials from your doctor or pharmacy?: 1 - Never What is the last grade level you completed in school?: 36yr college  Diabetic? no  Interpreter Needed?: No  Information entered by :: NAllen LPN   Activities of Daily Living    01/12/2022    4:17 PM  In your present state of health, do you have any difficulty performing the following activities:  Hearing? 0  Vision? 0  Difficulty concentrating or making decisions? 0  Walking or climbing stairs? 0  Dressing or bathing? 0  Doing errands,  shopping? 0  Preparing Food and eating ? N  Using the Toilet? N  In the past six months, have you accidently leaked urine? N  Do you have problems with loss of bowel control? N  Managing your Medications? N  Managing your Finances? N  Housekeeping or managing your Housekeeping? N    Patient Care Team: FLaurey Morale MD as PCP - General (Family Medicine)  Indicate any recent Medical Services you may have received from other than [facility] providers in the past year (date may be approximate).     Assessment:   [facility]  Hearing/Vision screen Vision Screening - Comments:: [facility]  Dietary issues and exercise activities discussed: Current Exercise Habits: Home  exercise routine, Type of exercise: strength training/weights;walking, Time (Minutes): 40, Frequency (Times/Week): 4, Weekly Exercise (Minutes/Week): 160   Goals Addressed             This Visit's Progress    Patient Stated       01/12/2022, wants to gain some weight and maintain       Depression Screen    01/12/2022    4:17 PM 10/07/2021    4:50 PM 08/05/2021    3:28 PM 12/13/2020    9:52 AM 12/13/2020    9:48 AM 09/19/2019    9:08 AM  PHQ 2/9 Scores  PHQ - 2 Score 0 0 0 0 0 0  PHQ- 9 Score  0 0       Fall Risk    01/12/2022    4:16 PM 10/07/2021    4:49 PM 08/05/2021    3:28 PM 12/13/2020    9:51 AM 09/19/2019    9:07 AM  [place] in the past year? 0 0 0 0 0  Number falls in past yr: 0 0 0 0 0  Injury with Fall? 0 0 0 0 0  Risk for fall due to : No Fall Risks No Fall Risks No Fall Risks    Follow up Falls evaluation completed;Education provided;Falls prevention discussed Falls evaluation completed  Falls evaluation completed     FALL RISK PREVENTION PERTAINING TO THE HOME:  Any stairs in or around the home? Yes  If so, are there any without handrails? No  Home free of loose throw rugs in walkways, pet beds, electrical cords, etc? Yes   Adequate lighting in your home to reduce risk of falls? Yes   ASSISTIVE DEVICES UTILIZED TO PREVENT FALLS:  Life alert? No  Use of a cane, walker or w/c? No  Grab bars in the bathroom? No  Shower chair or bench in shower? No  Elevated toilet seat or a handicapped toilet? Yes   TIMED UP AND GO:  Was the test performed? No .  .     Cognitive Function:        01/12/2022    4:18 PM  6CIT Screen  What Year? 0 points  What month? 0 points  What time? 0 points  Count back from 20 0 points  Months in reverse 0 points  Repeat phrase 6 points  Total Score 6 points    Immunizations Immunization History  Administered Date(s) Administered   Influenza, High Dose Seasonal PF 02/15/2015   Influenza,inj,Quad PF,6+ Mos 04/15/2016   Influenza-Unspecified 01/26/2008, 01/16/2009, 01/25/2012   Janssen (J&J) SARS-COV-2 Vaccination 08/04/2019   PFIZER(Purple Top)SARS-COV-2 Vaccination 06/27/2020   Pneumococcal Conjugate-13 05/18/2014, 02/15/2015   Pneumococcal Polysaccharide-23 04/15/2016   Tdap 12/12/2012, 05/18/2014    TDAP status: Up to date  Flu Vaccine status: Due, Education has been provided regarding the importance of this vaccine. Advised may receive this vaccine at local pharmacy or Health Dept. Aware to provide a copy of the vaccination record if obtained from local pharmacy or Health Dept. Verbalized acceptance and understanding.  Pneumococcal vaccine status: Up to date  Covid-19 vaccine status: Completed vaccines  Qualifies for Shingles Vaccine? Yes   Zostavax completed No   Shingrix Completed?: No.    Education has been provided regarding the importance of this vaccine. Patient has been advised to call insurance company to determine out of pocket expense if they have not yet received this vaccine. Advised may also receive vaccine at local pharmacy or Health Dept.  Verbalized acceptance and understanding.  Screening Tests Health Maintenance  Topic Date Due   Hepatitis  C Screening  Never done   Zoster Vaccines- Shingrix (1 of 2) Never done   COVID-19 Vaccine (3 - Booster for Janssen series) 08/22/2020   INFLUENZA VACCINE  12/16/2021   COLONOSCOPY (Pts 45-86yr Insurance coverage will need to be confirmed)  05/18/2024   TETANUS/TDAP  05/18/2024   Pneumonia Vaccine 71 Years old  Completed   HPV VACCINES  Aged Out    Health Maintenance  Health Maintenance Due  Topic Date Due   Hepatitis C Screening  Never done   Zoster Vaccines- Shingrix (1 of 2) Never done   COVID-19 Vaccine (3 - Booster for Janssen series) 08/22/2020   INFLUENZA VACCINE  12/16/2021    Colorectal cancer screening: Type of screening: Colonoscopy. Completed 05/18/2014. Repeat every 10 years [facility]  Lung Cancer Screening: (Low Dose CT Chest recommended if Age 267-80years, 30 pack-year currently smoking OR have quit w/in 15years.) does not qualify.   Lung Cancer Screening Referral: no  Additional Screening:  Hepatitis C Screening: does qualify;   Vision Screening: Recommended annual ophthalmology exams for early detection of glaucoma and other disorders of the eye. Is the patient up to date with their annual eye exam?  Yes  Who is the provider or what is the name of the office in which the patient attends annual [facility] exams? [facility] If pt is not established with a provider, would they like to be referred to a provider to establish care? No .   Dental Screening: Recommended annual dental exams for proper oral hygiene  Community Resource Referral / Chronic Care Management: CRR required this visit?  No   CCM required this visit?  No      Plan:     I have personally reviewed and noted the following in the patient's chart:   Medical and social history Use of alcohol, tobacco or illicit drugs  Current medications and supplements including opioid prescriptions. Patient is not currently taking opioid prescriptions. Functional ability and  status Nutritional status Physical activity Advanced directives List of other physicians Hospitalizations, surgeries, and ER visits in previous 12 months Vitals Screenings to include cognitive, depression, and falls Referrals and appointments  In addition, I have reviewed and discussed with patient certain preventive protocols, quality metrics, and best practice recommendations. A written personalized care plan for preventive services as well as general preventive health recommendations were provided to patient.     NKellie Simmering LPN   [id number]  Nurse Notes: none  Due to this being a virtual visit, the after visit summary with patients personalized plan was offered to patient via mail or my-chart.  to pick up at office at next visit

## 2022-01-12 NOTE — Patient Instructions (Signed)
[name] , Thank you for taking time to come for your Medicare Wellness Visit. I appreciate your ongoing commitment to your health goals. Please review the following plan we discussed and let me know if I can assist you in the future.   Screening recommendations/referrals: Colonoscopy: scheduled next week at the [facility] Recommended yearly ophthalmology/optometry visit for glaucoma screening and checkup Recommended yearly dental visit for hygiene and checkup  Vaccinations: Influenza vaccine: due Pneumococcal vaccine: completed 04/15/2016 Tdap vaccine: completed 05/18/2014, due 05/18/2024 Shingles vaccine: discussed   Covid-19:  06/27/2020, 08/04/2019  Advanced directives: Advance directive discussed with you today.   Conditions/risks identified: none  Next appointment: Follow up in one year for your annual wellness visit.   Preventive Care 47 Years and Older, Male Preventive care refers to lifestyle choices and visits with your health care provider that can promote health and wellness. What does preventive care include? A yearly physical exam. This is also called an annual well check. Dental exams once or twice a year. Routine eye exams. Ask your health care provider how often you should have your eyes checked. Personal lifestyle choices, including: Daily care of your teeth and gums. Regular physical activity. Eating a healthy diet. Avoiding tobacco and drug use. Limiting alcohol use. Practicing safe sex. Taking low doses of aspirin every day. Taking vitamin and mineral supplements as recommended by your health care provider. What happens during an annual well check? The services and screenings done by your health care provider during your annual well check will depend on your age, overall health, lifestyle risk factors, and family history of disease. Counseling  Your health care provider may ask you questions about your: Alcohol use. Tobacco use. Drug use. Emotional well-being. Home  and relationship well-being. Sexual activity. Eating habits. History of falls. Memory and ability to understand (cognition). Work and work Statistician. Screening  You may have the following tests or measurements: Height, weight, and BMI. Blood pressure. Lipid and cholesterol levels. These may be checked every 5 years, or more frequently if you are over 15 years old. Skin check. Lung cancer screening. You may have this screening every year starting at age 38 if you have a 30-pack-year history of smoking and currently smoke or have quit within the past 15 years. Fecal occult blood test (FOBT) of the stool. You may have this test every year starting at age 75. Flexible sigmoidoscopy or colonoscopy. You may have a sigmoidoscopy every 5 years or a colonoscopy every 10 years starting at age 6. Prostate cancer screening. Recommendations will vary depending on your family history and other risks. Hepatitis C blood test. Hepatitis B blood test. Sexually transmitted disease (STD) testing. Diabetes screening. This is done by checking your blood sugar (glucose) after you have not eaten for a while (fasting). You may have this done every 1-3 years. Abdominal aortic aneurysm (AAA) screening. You may need this if you are a current or former smoker. Osteoporosis. You may be screened starting at age 3 if you are at high risk. Talk with your health care provider about your test results, treatment options, and if necessary, the need for more tests. Vaccines  Your health care provider may recommend certain vaccines, such as: Influenza vaccine. This is recommended every year. Tetanus, diphtheria, and acellular pertussis (Tdap, Td) vaccine. You may need a Td booster every 10 years. Zoster vaccine. You may need this after age 67. Pneumococcal 13-valent conjugate (PCV13) vaccine. One dose is recommended after age 31. Pneumococcal polysaccharide (PPSV23) vaccine. One dose  is recommended after age 53. Talk to  your health care provider about which screenings and vaccines you need and how often you need them. This information is not intended to replace advice given to you by your health care provider. Make sure you discuss any questions you have with your health care provider. Document Released: 05/31/2015 Document Revised: 01/22/2016 Document Reviewed: 03/05/2015 Elsevier Interactive Patient Education  2017 [place] Prevention in the Home Falls can cause injuries. They can happen to people of all ages. There are many things you can do to make your home safe and to help prevent falls. What can I do on the outside of my home? Regularly fix the edges of walkways and driveways and fix any cracks. Remove anything that might make you trip as you walk through a door, such as a raised step or threshold. Trim any bushes or trees on the path to your home. Use bright outdoor lighting. Clear any walking paths of anything that might make someone trip, such as rocks or tools. Regularly check to see if handrails are loose or broken. Make sure that both sides of any steps have handrails. Any raised decks and porches should have guardrails on the edges. Have any leaves, snow, or ice cleared regularly. Use sand or salt on walking paths during winter. Clean up any spills in your garage right away. This includes oil or grease spills. What can I do in the bathroom? Use night lights. Install grab bars by the toilet and in the tub and shower. Do not use towel bars as grab bars. Use non-skid mats or decals in the tub or shower. If you need to sit down in the shower, use a plastic, non-slip stool. Keep the floor dry. Clean up any water that spills on the floor as soon as it happens. Remove soap buildup in the tub or shower regularly. Attach bath mats securely with double-sided non-slip rug tape. Do not have throw rugs and other things on the floor that can make you trip. What can I do in the bedroom? Use  night lights. Make sure that you have a light by your bed that is easy to reach. Do not use any sheets or blankets that are too big for your bed. They should not hang down onto the floor. Have a firm chair that has side arms. You can use this for support while you get dressed. Do not have throw rugs and other things on the floor that can make you trip. What can I do in the kitchen? Clean up any spills right away. Avoid walking on wet floors. Keep items that you use a lot in easy-to-reach places. If you need to reach something above you, use a strong step stool that has a grab bar. Keep electrical cords out of the way. Do not use floor polish or wax that makes floors slippery. If you must use wax, use non-skid floor wax. Do not have throw rugs and other things on the floor that can make you trip. What can I do with my stairs? Do not leave any items on the stairs. Make sure that there are handrails on both sides of the stairs and use them. Fix handrails that are broken or loose. Make sure that handrails are as long as the stairways. Check any carpeting to make sure that it is firmly attached to the stairs. Fix any carpet that is loose or worn. Avoid having throw rugs at the top or bottom of the stairs.  If you do have throw rugs, attach them to the floor with carpet tape. Make sure that you have a light switch at the top of the stairs and the bottom of the stairs. If you do not have them, ask someone to add them for you. What else can I do to help prevent falls? Wear shoes that: Do not have high heels. Have rubber bottoms. Are comfortable and fit you well. Are closed at the toe. Do not wear sandals. If you use a stepladder: Make sure that it is fully opened. Do not climb a closed stepladder. Make sure that both sides of the stepladder are locked into place. Ask someone to hold it for you, if possible. Clearly mark and make sure that you can see: Any grab bars or handrails. First and last  steps. Where the edge of each step is. Use tools that help you move around (mobility aids) if they are needed. These include: Canes. Walkers. Scooters. Crutches. Turn on the lights when you go into a dark area. Replace any light bulbs as soon as they burn out. Set up your furniture so you have a clear path. Avoid moving your furniture around. If any of your floors are uneven, fix them. If there are any pets around you, be aware of where they are. Review your medicines with your doctor. Some medicines can make you feel dizzy. This can increase your chance of falling. Ask your doctor what other things that you can do to help prevent falls. This information is not intended to replace advice given to you by your health care provider. Make sure you discuss any questions you have with your health care provider. Document Released: 02/28/2009 Document Revised: 10/10/2015 Document Reviewed: 06/08/2014 Elsevier Interactive Patient Education  2017 [employer].

## 2022-01-23 ENCOUNTER — Telehealth: Payer: Self-pay | Admitting: Family Medicine

## 2022-01-23 MED ORDER — SILDENAFIL CITRATE 100 MG PO TABS: 100.0000 mg | ORAL_TABLET | Freq: Every day | ORAL | 11 refills | Status: DC | PRN

## 2022-01-23 NOTE — Telephone Encounter (Signed)
Done

## 2022-11-25 ENCOUNTER — Ambulatory Visit: Payer: No Typology Code available for payment source | Admitting: Family Medicine

## 2023-09-21 ENCOUNTER — Ambulatory Visit: Payer: No Typology Code available for payment source | Admitting: Family Medicine

## 2023-10-19 ENCOUNTER — Encounter: Admitting: Family Medicine

## 2023-10-25 ENCOUNTER — Encounter: Payer: Self-pay | Admitting: Family Medicine

## 2023-10-25 ENCOUNTER — Ambulatory Visit (INDEPENDENT_AMBULATORY_CARE_PROVIDER_SITE_OTHER): Admitting: Family Medicine

## 2023-10-25 VITALS — BP 120/80 | HR 52 | Temp 98.7°F | Ht 72.0 in | Wt 134.0 lb

## 2023-10-25 DIAGNOSIS — N138 Other obstructive and reflux uropathy: Secondary | ICD-10-CM

## 2023-10-25 DIAGNOSIS — N401 Enlarged prostate with lower urinary tract symptoms: Secondary | ICD-10-CM

## 2023-10-25 DIAGNOSIS — R739 Hyperglycemia, unspecified: Secondary | ICD-10-CM

## 2023-10-25 DIAGNOSIS — D179 Benign lipomatous neoplasm, unspecified: Secondary | ICD-10-CM | POA: Insufficient documentation

## 2023-10-25 DIAGNOSIS — E559 Vitamin D deficiency, unspecified: Secondary | ICD-10-CM | POA: Diagnosis not present

## 2023-10-25 DIAGNOSIS — E785 Hyperlipidemia, unspecified: Secondary | ICD-10-CM

## 2023-10-25 DIAGNOSIS — A6000 Herpesviral infection of urogenital system, unspecified: Secondary | ICD-10-CM

## 2023-10-25 DIAGNOSIS — N529 Male erectile dysfunction, unspecified: Secondary | ICD-10-CM

## 2023-10-25 MED ORDER — ACYCLOVIR 800 MG PO TABS: 800.0000 mg | ORAL_TABLET | Freq: Every day | ORAL | 5 refills | Status: AC

## 2023-10-25 MED ORDER — SILDENAFIL CITRATE 100 MG PO TABS: 100.0000 mg | ORAL_TABLET | Freq: Every day | ORAL | 11 refills | Status: AC | PRN

## 2023-10-25 NOTE — Progress Notes (Signed)
 Subjective:    Patient ID: Mark Cordova, male    DOB: 1950/06/20, 73 y.o.   MRN: [id number]  HPI Here to follow up on issues. He feels well in general. He still has sensitivity in the left testicle, but this has not changed. He asks us  to check the lipomas on his arm and abdomen. They are not changing and they do not bother him. The Sildenafil  still works well.    Review of Systems  Constitutional: Negative.   HENT: Negative.    Eyes: Negative.   Respiratory: Negative.    Cardiovascular: Negative.   Gastrointestinal: Negative.   Genitourinary: Negative.   Musculoskeletal: Negative.   Skin: Negative.   Neurological: Negative.   Psychiatric/Behavioral: Negative.         Objective:   Physical Exam Constitutional:      General: He is not in acute distress.    Appearance: Normal appearance. He is well-developed. He is not diaphoretic.  HENT:     Head: Normocephalic and atraumatic.     Right Ear: External ear normal.     Left Ear: External ear normal.     Nose: Nose normal.     Mouth/Throat:     Pharynx: No oropharyngeal exudate.  Eyes:     General: No scleral icterus.       Right eye: No discharge.        Left eye: No discharge.     Conjunctiva/sclera: Conjunctivae normal.     Pupils: Pupils are equal, round, and reactive to light.  Neck:     Thyroid : No thyromegaly.     Vascular: No JVD.     Trachea: No tracheal deviation.  Cardiovascular:     Rate and Rhythm: Normal rate and regular rhythm.     Pulses: Normal pulses.     Heart sounds: Normal heart sounds. No murmur heard.    No friction rub. No gallop.  Pulmonary:     Effort: Pulmonary effort is normal. No respiratory distress.     Breath sounds: Normal breath sounds. No wheezing or rales.  Chest:     Chest wall: No tenderness.  Abdominal:     General: Bowel sounds are normal. There is no distension.     Palpations: Abdomen is soft. There is no mass.     Tenderness: There is no abdominal tenderness. There is no  guarding or rebound.  Genitourinary:    Penis: Normal. No tenderness.      Comments: Left testicle is sensitive to touch, no masses  Musculoskeletal:        General: No tenderness. Normal range of motion.     Cervical back: Neck supple.  Lymphadenopathy:     Cervical: No cervical adenopathy.  Skin:    General: Skin is warm and dry.     Coloration: Skin is not pale.     Findings: No erythema or rash.     Comments: Firm mobile non-tender lumps under the skin on the left upper and and on the lower abdomen, both about 3 cm in diameter  Neurological:     Mental Status: He is alert and oriented to person, place, and time.     Cranial Nerves: No cranial nerve deficit.     Motor: No abnormal muscle tone.     Coordination: Coordination normal.     Deep Tendon Reflexes: Reflexes are normal and symmetric. Reflexes normal.  Psychiatric:        Behavior: Behavior normal.  Thought Content: Thought content normal.        Judgment: Judgment normal.           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 His ED is stable. We will get labs to check lipids, A1c, etc. We spent a total of ( 32  ) minutes reviewing records and discussing these issues.  Corita Diego, MD

## 2023-10-26 ENCOUNTER — Other Ambulatory Visit

## 2023-11-08 ENCOUNTER — Other Ambulatory Visit (HOSPITAL_COMMUNITY): Payer: Self-pay

## 2023-11-08 ENCOUNTER — Telehealth: Payer: Self-pay

## 2023-11-08 NOTE — Telephone Encounter (Signed)
 Pharmacy Patient Advocate Encounter   Received notification from Onbase that prior authorization for ACYCLOVIR  800MG  TABS is required/requested.   Insurance verification completed.   The patient is insured through [facility] .   Per test claim: PA required; PA submitted to above mentioned insurance via CoverMyMeds Key/confirmation #/EOC [facility] Status is pending

## 2023-11-09 ENCOUNTER — Other Ambulatory Visit (HOSPITAL_COMMUNITY): Payer: Self-pay

## 2023-11-09 NOTE — Telephone Encounter (Signed)
 Pharmacy Patient Advocate Encounter  Received notification from [employer] that Prior Authorization for ACYCLOVIR  800MG  TABS has been DENIED.  Full denial letter will be uploaded to the media tab. See denial reason below.    PA #/Case ID/Reference #: [id number]

## 2023-11-10 NOTE — Telephone Encounter (Signed)
 So what are we supposed to do now?

## 2023-11-10 NOTE — Telephone Encounter (Signed)
 Spoke with pt advised to contact his insurance to find out the reason for denial and if there is alternative to this Rx, pt will call the office with update

## 2023-11-16 ENCOUNTER — Ambulatory Visit: Admitting: Family Medicine

## 2023-11-17 ENCOUNTER — Encounter: Payer: Self-pay | Admitting: Family Medicine

## 2023-11-17 ENCOUNTER — Ambulatory Visit (INDEPENDENT_AMBULATORY_CARE_PROVIDER_SITE_OTHER): Admitting: Family Medicine

## 2023-11-17 VITALS — BP 124/80 | HR 66 | Temp 97.9°F | Wt 134.0 lb

## 2023-11-17 DIAGNOSIS — N6342 Unspecified lump in left breast, subareolar: Secondary | ICD-10-CM

## 2023-11-17 NOTE — Progress Notes (Signed)
   Subjective:    Patient ID: Mark Cordova, male    DOB: [birth year], 73 y.o.   MRN: [id number]  HPI Here to check a tender lump in the left breast that appeared several weeks ago. This has grown larger and and it has become quite tender. We know he has several lipomas, and he has assumed this was another one.    Review of Systems  Constitutional: Negative.   Respiratory: Negative.    Cardiovascular:  Positive for chest pain. Negative for palpitations and leg swelling.  Skin: Negative.        Objective:   Physical Exam Constitutional:      Appearance: Normal appearance.  Cardiovascular:     Rate and Rhythm: Normal rate and regular rhythm.     Pulses: Normal pulses.     Heart sounds: Normal heart sounds.  Pulmonary:     Effort: Pulmonary effort is normal.     Breath sounds: Normal breath sounds.     Comments: There is a firm mobile well defined mass under the left areola that is tender. The left axilla is clear  Neurological:     Mental Status: He is alert.           Assessment & Plan:  Left breast mass. We will evaluate this with an US  of the breast and axilla.  Garnette Olmsted, MD

## 2023-11-23 ENCOUNTER — Other Ambulatory Visit: Payer: Self-pay | Admitting: Family Medicine

## 2023-11-23 DIAGNOSIS — N6342 Unspecified lump in left breast, subareolar: Secondary | ICD-10-CM

## 2023-12-01 ENCOUNTER — Other Ambulatory Visit: Payer: Self-pay | Admitting: Family Medicine

## 2023-12-01 ENCOUNTER — Ambulatory Visit
Admission: RE | Admit: 2023-12-01 | Discharge: 2023-12-01 | Disposition: A | Discharge status: Home / Self Care | Source: Ambulatory Visit | Attending: Family Medicine | Admitting: Family Medicine

## 2023-12-01 ENCOUNTER — Ambulatory Visit: Payer: Self-pay | Admitting: Family Medicine

## 2023-12-01 DIAGNOSIS — N6342 Unspecified lump in left breast, subareolar: Secondary | ICD-10-CM

## 2023-12-01 DIAGNOSIS — N62 Hypertrophy of breast: Secondary | ICD-10-CM | POA: Diagnosis not present

## 2023-12-14 ENCOUNTER — Ambulatory Visit: Admitting: Family Medicine

## 2023-12-20 ENCOUNTER — Encounter: Payer: Self-pay | Admitting: Family Medicine

## 2023-12-20 ENCOUNTER — Ambulatory Visit (INDEPENDENT_AMBULATORY_CARE_PROVIDER_SITE_OTHER): Admitting: Family Medicine

## 2023-12-20 VITALS — BP 118/68 | HR 56 | Temp 98.1°F | Wt 131.4 lb

## 2023-12-20 DIAGNOSIS — N6342 Unspecified lump in left breast, subareolar: Secondary | ICD-10-CM | POA: Insufficient documentation

## 2023-12-20 NOTE — Progress Notes (Signed)
   Subjective:    Patient ID: Mark Cordova, male    DOB: 26-Aug-1950, 73 y.o.   MRN: [id number]  HPI Here to follow up on a left breast mass. He had a mammogram and US  of this on 12-01-23, and this revealed only asymmetrical gynecomastia. The lump has been tender the whole time, but he says it is rapidly getting larger.    Review of Systems  Constitutional: Negative.   Respiratory: Negative.    Cardiovascular: Negative.        Objective:   Physical Exam Constitutional:      Appearance: Normal appearance.  Cardiovascular:     Rate and Rhythm: Normal rate and regular rhythm.     Pulses: Normal pulses.     Heart sounds: Normal heart sounds.  Pulmonary:     Effort: Pulmonary effort is normal.     Breath sounds: Normal breath sounds.     Comments: There is a 3 cm firm mobile tender lump in the left breast beneath the areola  Neurological:     Mental Status: He is alert.           Assessment & Plan:  He has a left breast mass that appears to be benign, but this is painful and getting larger. We will refer him to Surgery to have this removed.  Garnette Olmsted, MD

## 2024-05-29 ENCOUNTER — Encounter: Payer: Self-pay | Admitting: Family Medicine

## 2024-05-29 ENCOUNTER — Ambulatory Visit: Admitting: Family Medicine

## 2024-05-29 VITALS — BP 110/62 | HR 61 | Temp 97.6°F | Wt 145.0 lb

## 2024-05-29 DIAGNOSIS — M79675 Pain in left toe(s): Secondary | ICD-10-CM

## 2024-05-29 DIAGNOSIS — G8929 Other chronic pain: Secondary | ICD-10-CM | POA: Diagnosis not present

## 2024-05-29 NOTE — Progress Notes (Signed)
" ° °  Subjective:    Patient ID: Mark Cordova, male    DOB: 01/06/1951, 75 y.o.   MRN: [id number]  HPI Here asking for help with painful toes in the left foot. He had a surgery to repair a hammertoe at the [facility] about 30 years ago, but over the past year the 2nd toe and the great toe have been quite painful.   Review of Systems  Constitutional: Negative.   Respiratory: Negative.    Cardiovascular: Negative.   Musculoskeletal:  Positive for arthralgias.       Objective:   Physical Exam Constitutional:      Appearance: Normal appearance.  Cardiovascular:     Rate and Rhythm: Normal rate and regular rhythm.     Pulses: Normal pulses.     Heart sounds: Normal heart sounds.  Pulmonary:     Effort: Pulmonary effort is normal.     Breath sounds: Normal breath sounds.  Musculoskeletal:     Comments: He has a tender hammertoe in the left 2nd toe, and the left great toe is deviated laterally so that it crowds the 2nd toe   Neurological:     Mental Status: He is alert.           Assessment & Plan:  Painful toes, refer to Podiatry.  Garnette Olmsted, MD   "

## 2024-06-08 ENCOUNTER — Ambulatory Visit

## 2024-06-08 ENCOUNTER — Ambulatory Visit (INDEPENDENT_AMBULATORY_CARE_PROVIDER_SITE_OTHER): Admitting: Podiatry

## 2024-06-08 DIAGNOSIS — M2042 Other hammer toe(s) (acquired), left foot: Secondary | ICD-10-CM

## 2024-06-08 DIAGNOSIS — E559 Vitamin D deficiency, unspecified: Secondary | ICD-10-CM

## 2024-06-08 DIAGNOSIS — M7752 Other enthesopathy of left foot: Secondary | ICD-10-CM

## 2024-06-08 DIAGNOSIS — M205X2 Other deformities of toe(s) (acquired), left foot: Secondary | ICD-10-CM | POA: Diagnosis not present

## 2024-06-08 NOTE — Patient Instructions (Signed)

## 2024-06-08 NOTE — Progress Notes (Signed)
 "  Subjective:  Patient ID: Mark Cordova, male    DOB: January 06, 1951,  MRN: [id number]  Chief Complaint  Patient presents with   Toe Pain    Pt is here due to pain in the left second toe, toe is curl up and causing pain, would like to discuss options on fixing the toe.    Discussed the use of AI scribe software for clinical note transcription with the patient, who gave verbal consent to proceed.  History of Present Illness Mark Cordova is a 74 year old male with prior left foot hammer toe surgery who presents with severe pain and deformity of the left second toe.  He has constant severe pain isolated to the left second toe that is intense and sometimes incapacitating. Pain worsens with shoe wear and prolonged walking. He is often unable to tolerate shoes and has stopped walking in the park. He occasionally cries out from pain while ambulating. There is no pain in the other toes and no pain at a left foot callus.  He had left second toe hammer toe surgery about 5-6 years ago without wire placement. The toe was asymptomatic for a period, then pain and deformity gradually recurred and have progressively worsened over the last 5-6 months. He has not used pain medication or other treatments and prefers to avoid pharmacologic management.  There is also a bunion deformity present with stiff big toe joint present which causes pressure on the adjacent second toe.  He reports no cardiovascular, hypertensive, diabetic, renal, or gastrointestinal disease and takes no medications, with no medication intolerance. He had a total knee replacement about five years ago with good recovery. He occasionally uses marijuana and does not use tobacco.  His wife is in hospice care and wheelchair-bound, and he is her primary caregiver, which may limit his ability to attend appointments and recover from potential procedures.      Objective:    Physical Exam  DP and PT pulse palpable 2/4 bilaterally.  Cap refill intact to  the digits.  Pedal hair growth noted.  No diffuse edema.    Left foot hammertoe deformities present with pain on palpation of second toe, pain with left second toe passive range of motion.  Hallux limitus noted with bunion deformity present.  Light touch sensation intact.  Protective sensation intact.  Pedal skin well-hydrated within normal limits for skin texture and skin turgor.  Well-healed scars dorsal second, third, fourth toes.   No images are attached to the encounter.    Results Radiology Left foot X-ray: Joint space narrowing and osteoarthritic changes to the first MPJ.  Some cystic changes present to the met head.  Enlarged medial prominence.  Moderate bunion deformity present.  Sesamoid rotation noted.  Pes planus foot type.; marked contractures of the second, third, fourth, and fifth toes at the interphalangeal joint level. (Independently interpreted)   Assessment:   1. Hammer toe of left foot   2. Hallux limitus of left foot   3. Vitamin D  deficiency      Plan:  Patient was evaluated and treated and all questions answered.  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 Imaging shows contracture, stiffness, joint space narrowing, and early arthritis. Adjacent toes have contractures but are asymptomatic. Surgical management complicated by prior intervention and adjacent deformities. - Obtained consent for left first MPJ arthrodesis, left second  toe hammertoe repair with fixation -Discussed possible need for future procedures of adjacent remaining toes if they become symptomatic in the future -Postoperative course discussed at length, initial need for being weightbearing as tolerated in surgical shoe or boot favoring heel.   Likely return to regular shoes around 8 weeks. - Reviewed pain management:  acetaminophen  and ibuprofen as first-line agents, short course of oxycodone  if needed, possible adjunctive gabapentin. - Planned short course of postoperative antibiotics. -Surgery scheduling to contact patient - Provided surgical shoe and postoperative instructions. - Discussed risks of surgical intervention: pain, bleeding, wound healing complications, infection (including hardware or bone infection which could lead to amputation), chronic pain, nerve damage, numbness, and issues with bone healing. Anticipated outcome is improved pain and function, with possible persistent swelling for 6-12 months postoperatively.  Patient expressed good understanding and is electing to proceed.  Written consent obtained. - History of vitamin D  deficiency, ordering vitamin D  levels in order to optimize for bone healing.      Return for Post op XR.   "

## 2024-06-13 ENCOUNTER — Ambulatory Visit: Admitting: Family Medicine

## 2024-06-14 ENCOUNTER — Ambulatory Visit: Admitting: Family Medicine

## 2024-10-25 ENCOUNTER — Encounter: Admitting: Family Medicine
